# Patient Record
Sex: Female | Born: 1975 | Race: White | Hispanic: No | Marital: Married | Smoking: Never smoker
Health system: Southern US, Community
[De-identification: ages and names within clinical notes are randomized; demographics above are authoritative.]

## PROBLEM LIST (undated history)

## (undated) DIAGNOSIS — R569 Unspecified convulsions: Secondary | ICD-10-CM

## (undated) DIAGNOSIS — R51 Headache: Secondary | ICD-10-CM

## (undated) DIAGNOSIS — R519 Headache, unspecified: Secondary | ICD-10-CM

## (undated) DIAGNOSIS — D649 Anemia, unspecified: Secondary | ICD-10-CM

## (undated) DIAGNOSIS — M1990 Unspecified osteoarthritis, unspecified site: Secondary | ICD-10-CM

## (undated) DIAGNOSIS — G40909 Epilepsy, unspecified, not intractable, without status epilepticus: Secondary | ICD-10-CM

---

## 2014-03-03 ENCOUNTER — Ambulatory Visit: Payer: Self-pay | Admitting: Family Medicine

## 2014-07-18 ENCOUNTER — Emergency Department
Admission: EM | Admit: 2014-07-18 | Discharge: 2014-07-18 | Disposition: A | Discharge status: Home / Self Care | Payer: PRIVATE HEALTH INSURANCE | Attending: Emergency Medicine | Admitting: Emergency Medicine

## 2014-07-18 DIAGNOSIS — R569 Unspecified convulsions: Secondary | ICD-10-CM | POA: Diagnosis present

## 2014-07-18 DIAGNOSIS — G40909 Epilepsy, unspecified, not intractable, without status epilepticus: Secondary | ICD-10-CM | POA: Insufficient documentation

## 2014-07-18 DIAGNOSIS — Z79899 Other long term (current) drug therapy: Secondary | ICD-10-CM | POA: Diagnosis not present

## 2014-07-18 HISTORY — DX: Unspecified convulsions: R56.9

## 2014-07-18 HISTORY — DX: Epilepsy, unspecified, not intractable, without status epilepticus: G40.909

## 2014-07-18 LAB — PHENYTOIN LEVEL, TOTAL: Phenytoin Lvl: 13.7 ug/mL (ref 10.0–20.0)

## 2014-07-18 NOTE — ED Notes (Signed)
Pt alert and oriented X4, active, cooperative, pt in NAD. RR even and unlabored, color WNL.  Pt informed to return if any life threatening symptoms occur.  Left with family.  

## 2014-07-18 NOTE — ED Notes (Signed)
[facility].  Has not had seizure in 10 years per husband.  Pt post dictal and slightly confused on arrival.

## 2014-07-18 NOTE — ED Notes (Signed)
E signature will not accept at this time.

## 2014-07-18 NOTE — ED Notes (Addendum)
Pt had seizure while at breakfast this AM. Pt taken dilantin, took dose this AM. Pt denies aura before seizure, denies feeling post ictal. Pt alert and oriented X4, active, cooperative, pt in NAD. RR even and unlabored, color WNL.  Pt states that she was in floor when she had seizure, denies fall. EDP at bedside prior to assessment.

## 2014-07-18 NOTE — ED Provider Notes (Signed)
[facility] Emergency Department Provider Note  ____________________________________________  Time seen: 11 AM  I have reviewed the triage vital signs and the nursing notes.   HISTORY  Chief Complaint Seizures      HPI Tara Carlson is a 39 y.o. female who presents after a seizure. Patient has a history of epilepsy but has not had a seizure in 10 years mainly because of excellent compliance with Dilantin. She admits that she missed her nighttime dose of Dilantin last night which is what likely caused her seizure this morning. Reportedly she had a generalized tonic-clonic seizure at [employer] this morning. No injury to head or tongue. No loss of continence. Currently she feels well and has no complaints     Past Medical History  Diagnosis Date  . Seizure disorder   . Seizures     There are no active problems to display for this patient.   No past surgical history on file.  Current Outpatient Rx  Name  Route  Sig  Dispense  Refill  . phenytoin (DILANTIN) 100 MG ER capsule   Oral   Take 100 mg by mouth 2 (two) times daily. 2 tabs every morning andf 3 tabs every night           Allergies Review of patient's allergies indicates no known allergies.  No family history on file.  Social History History  Substance Use Topics  . Smoking status: Never Smoker   . Smokeless tobacco: Not on file  . Alcohol Use: No    Review of Systems  Constitutional: Negative for fever. Eyes: Negative for visual changes. ENT: Negative for sore throat. Cardiovascular: Negative for chest pain. Respiratory: Negative for shortness of breath. Gastrointestinal: Negative for abdominal pain, vomiting and diarrhea. Genitourinary: Negative for dysuria. Musculoskeletal: Negative for back pain. Skin: Negative for rash. Neurological: Negative for headaches, focal weakness or numbness. Psychiatric: No anxiety  10-point ROS otherwise  negative.  ____________________________________________   PHYSICAL EXAM:  VITAL SIGNS: ED Triage Vitals  Enc Vitals Group     BP --      Pulse --      Resp --      Temp --      Temp src --      SpO2 07/18/14 1020 96 %     Weight --      Height --      Head Cir --      Peak Flow --      Pain Score 07/18/14 1042 0     Pain Loc --      Pain Edu? --      Excl. in GC? --      Constitutional: Alert and oriented. Well appearing and in no distress. Eyes: Conjunctivae are normal. PERRL. Normal extraocular movements. ENT   Head: Normocephalic and atraumatic.   Nose: No congestion/rhinnorhea.   Mouth/Throat: Mucous membranes are moist.   Neck: No stridor. Hematological/Lymphatic/Immunilogical: No cervical lymphadenopathy. Cardiovascular: Normal rate, regular rhythm. Normal and symmetric distal pulses are present in all extremities. No murmurs, rubs, or gallops. Respiratory: Normal respiratory effort without tachypnea nor retractions. Breath sounds are clear and equal bilaterally. No wheezes/rales/rhonchi. Gastrointestinal: Soft and nontender. No distention. There is no CVA tenderness. Genitourinary: deferred Musculoskeletal: Nontender with normal range of motion in all extremities. No joint effusions.  No lower extremity tenderness nor edema. Neurologic:  Normal speech and language. No gross focal neurologic deficits are appreciated. Speech is normal.  Skin:  Skin is warm, dry  and intact. No rash noted. Psychiatric: Mood and affect are normal. Speech and behavior are normal. Patient exhibits appropriate insight and judgment.  ____________________________________________    LABS (pertinent positives/negatives)  Labs Reviewed  PHENYTOIN LEVEL, TOTAL     ____________________________________________   EKG  None  ____________________________________________    RADIOLOGY  None  ____________________________________________   PROCEDURES  Procedure(s)  performed:None  Critical Care performed: None  ____________________________________________   INITIAL IMPRESSION / ASSESSMENT AND PLAN / ED COURSE  Pertinent labs & imaging results that were available during my care of the patient were reviewed by me and considered in my medical decision making (see chart for details).  Seizure likely related to missed dose of Dilantin last night. We will check Dilantin level and observe patient in the ED  ____________________________________________ ----------------------------------------- 1:11 PM on 07/18/2014 -----------------------------------------  No seizure-like activity in ED. Dilantin level normal. Patient will resume normal Dilantin schedule.  FINAL CLINICAL IMPRESSION(S) / ED DIAGNOSES  Final diagnoses:  Seizure     Jene Everyobert Niclas Markell, MD 07/18/14 1311

## 2014-07-18 NOTE — Discharge Instructions (Signed)

## 2014-08-24 ENCOUNTER — Other Ambulatory Visit: Payer: Self-pay | Admitting: Family Medicine

## 2014-08-24 DIAGNOSIS — G40909 Epilepsy, unspecified, not intractable, without status epilepticus: Secondary | ICD-10-CM | POA: Insufficient documentation

## 2015-03-18 ENCOUNTER — Other Ambulatory Visit: Payer: Self-pay | Admitting: Family Medicine

## 2015-03-18 DIAGNOSIS — G40909 Epilepsy, unspecified, not intractable, without status epilepticus: Secondary | ICD-10-CM

## 2015-04-08 ENCOUNTER — Encounter: Payer: Self-pay | Admitting: Family Medicine

## 2015-04-08 ENCOUNTER — Ambulatory Visit (INDEPENDENT_AMBULATORY_CARE_PROVIDER_SITE_OTHER): Payer: PRIVATE HEALTH INSURANCE | Admitting: Family Medicine

## 2015-04-08 VITALS — BP 140/92 | HR 93 | Temp 98.2°F | Resp 16 | Ht 68.0 in | Wt 255.0 lb

## 2015-04-08 DIAGNOSIS — R4020 Unspecified coma: Secondary | ICD-10-CM | POA: Insufficient documentation

## 2015-04-08 DIAGNOSIS — Z87820 Personal history of traumatic brain injury: Secondary | ICD-10-CM | POA: Diagnosis not present

## 2015-04-08 DIAGNOSIS — Z9289 Personal history of other medical treatment: Secondary | ICD-10-CM | POA: Insufficient documentation

## 2015-04-08 DIAGNOSIS — G40909 Epilepsy, unspecified, not intractable, without status epilepticus: Secondary | ICD-10-CM

## 2015-04-08 DIAGNOSIS — M940 Chondrocostal junction syndrome [Tietze]: Secondary | ICD-10-CM | POA: Insufficient documentation

## 2015-04-08 DIAGNOSIS — E559 Vitamin D deficiency, unspecified: Secondary | ICD-10-CM | POA: Insufficient documentation

## 2015-04-08 NOTE — Patient Instructions (Signed)
We will call you with the lab results. 

## 2015-04-08 NOTE — Progress Notes (Signed)
Subjective:     Patient ID: Tara Carlson, female   DOB: 02-25-1976, 40 y.o.   MRN: [id number]  HPI  Chief Complaint  Patient presents with  . Facial Droop    Patient comes in office today accompanied by her husband with concerns of a possible seizure this morning. Patients spouse reports that employeers had noticed thart patient was staring blankly but was alert while at work, it was reported that patient had drooping off right side of face and arm. Patient denies any speech disturbance, visual changes or syncope episode. Patient mentioned that for the past several days she has had pain in her neck and back,patient reports that she does have a buldging disc.  Reports compliance with dilantin as prescribed. Last seizure documented in ER note last May. States she did not loss consciousness nor was incontinent during her episode today. Episode lasted about 5 minutes per her report. Reports eating breakfast prior to work.States that Sunday will be the anniversary of her GrFa's death. She is tearful talking about this. Accompanied by her husband today.   Review of Systems     Objective:   Physical Exam  Constitutional: She appears well-developed and well-nourished. No distress.  Eyes: EOM are normal. Pupils are equal, round, and reactive to light.  Musculoskeletal:  Grip strength 5/5 symmetrically.  Neurological: She is alert. Coordination (Romberg negative; Finger to Nose WNL) normal.       Assessment:    1. Seizure disorder [facility]): episode today does not well fit seizure criteria if she was aware of her surroundings. ? Psycho-emotional trigger. - Comprehensive metabolic panel - CBC with Differential/Platelet - Dilantin (Phenytoin) level, total - TSH    Plan:    Further f/u pending labs. Consider cranial CT if recurrent or SSRI if labs ok.

## 2015-04-09 ENCOUNTER — Telehealth: Payer: Self-pay

## 2015-04-09 LAB — COMPREHENSIVE METABOLIC PANEL
ALT: 16 IU/L (ref 0–32)
AST: 16 IU/L (ref 0–40)
Albumin/Globulin Ratio: 1.7 (ref 1.1–2.5)
Albumin: 4.3 g/dL (ref 3.5–5.5)
Alkaline Phosphatase: 114 IU/L (ref 39–117)
BUN/Creatinine Ratio: 13 (ref 9–23)
BUN: 8 mg/dL (ref 6–24)
Bilirubin Total: 0.2 mg/dL (ref 0.0–1.2)
CALCIUM: 8.9 mg/dL (ref 8.7–10.2)
CO2: 23 mmol/L (ref 18–29)
Chloride: 99 mmol/L (ref 96–106)
Creatinine, Ser: 0.61 mg/dL (ref 0.57–1.00)
GFR calc Af Amer: 130 mL/min/{1.73_m2} (ref >59)
GFR, EST NON AFRICAN AMERICAN: 113 mL/min/{1.73_m2} (ref >59)
Globulin, Total: 2.6 g/dL (ref 1.5–4.5)
Glucose: 92 mg/dL (ref 65–99)
POTASSIUM: 4.5 mmol/L (ref 3.5–5.2)
Sodium: 138 mmol/L (ref 134–144)
Total Protein: 6.9 g/dL (ref 6.0–8.5)

## 2015-04-09 LAB — CBC WITH DIFFERENTIAL/PLATELET
BASOS ABS: 0 10*3/uL (ref 0.0–0.2)
Basos: 1 %
EOS (ABSOLUTE): 0 10*3/uL (ref 0.0–0.4)
Eos: 1 %
Hematocrit: 37 % (ref 34.0–46.6)
Hemoglobin: 12.6 g/dL (ref 11.1–15.9)
IMMATURE GRANS (ABS): 0 10*3/uL (ref 0.0–0.1)
IMMATURE GRANULOCYTES: 0 %
LYMPHS: 13 %
Lymphocytes Absolute: 0.7 10*3/uL (ref 0.7–3.1)
MCH: 31.2 pg (ref 26.6–33.0)
MCHC: 34.1 g/dL (ref 31.5–35.7)
MCV: 92 fL (ref 79–97)
Monocytes Absolute: 0.4 10*3/uL (ref 0.1–0.9)
Monocytes: 7 %
NEUTROS PCT: 78 %
Neutrophils Absolute: 4.2 10*3/uL (ref 1.4–7.0)
Platelets: 256 10*3/uL (ref 150–379)
RBC: 4.04 x10E6/uL (ref 3.77–5.28)
RDW: 12.7 % (ref 12.3–15.4)
WBC: 5.4 10*3/uL (ref 3.4–10.8)

## 2015-04-09 LAB — TSH: TSH: 1.67 u[IU]/mL (ref 0.450–4.500)

## 2015-04-09 LAB — PHENYTOIN LEVEL, TOTAL: PHENYTOIN (DILANTIN), SERUM: 22.3 ug/mL — AB (ref 10.0–20.0)

## 2015-04-09 NOTE — Telephone Encounter (Signed)
[id number] 

## 2015-04-09 NOTE — Telephone Encounter (Signed)
Pt returned call. Thanks TNP °

## 2015-04-09 NOTE — Telephone Encounter (Signed)
-----   Message from Anola Gurney, [place] sent at 04/09/2015  7:57 AM EST ----- Labs look good. Dilantin level is not low. Would you like to see a neurologist?

## 2015-04-12 ENCOUNTER — Other Ambulatory Visit: Payer: Self-pay | Admitting: Family Medicine

## 2015-04-12 DIAGNOSIS — G40909 Epilepsy, unspecified, not intractable, without status epilepticus: Secondary | ICD-10-CM

## 2015-04-12 NOTE — Telephone Encounter (Signed)
Patient advised as below. Patient is willing to see neurologist for further evaluation.

## 2015-04-12 NOTE — Telephone Encounter (Signed)
Pt called back to get results. Pt request a call back on work # today if possible. Thanks TNP

## 2015-04-12 NOTE — Telephone Encounter (Signed)
Referral in progress. 

## 2015-05-04 ENCOUNTER — Other Ambulatory Visit: Payer: Self-pay | Admitting: Neurology

## 2015-05-04 DIAGNOSIS — G40919 Epilepsy, unspecified, intractable, without status epilepticus: Secondary | ICD-10-CM

## 2015-05-17 ENCOUNTER — Ambulatory Visit: Payer: PRIVATE HEALTH INSURANCE

## 2015-07-15 ENCOUNTER — Ambulatory Visit
Admission: RE | Admit: 2015-07-15 | Discharge: 2015-07-15 | Disposition: A | Discharge status: Home / Self Care | Payer: PRIVATE HEALTH INSURANCE | Source: Ambulatory Visit | Attending: Neurology | Admitting: Neurology

## 2015-07-15 DIAGNOSIS — G40319 Generalized idiopathic epilepsy and epileptic syndromes, intractable, without status epilepticus: Secondary | ICD-10-CM | POA: Diagnosis present

## 2015-07-15 DIAGNOSIS — G40919 Epilepsy, unspecified, intractable, without status epilepticus: Secondary | ICD-10-CM

## 2015-07-15 DIAGNOSIS — H7091 Unspecified mastoiditis, right ear: Secondary | ICD-10-CM | POA: Diagnosis not present

## 2015-07-15 MED ORDER — GADOBENATE DIMEGLUMINE 529 MG/ML IV SOLN
20.0000 mL | Freq: Once | INTRAVENOUS | Status: AC | PRN
  Administered 2015-07-15: 20 mL via INTRAVENOUS

## 2015-08-22 ENCOUNTER — Emergency Department
Admission: EM | Admit: 2015-08-22 | Discharge: 2015-08-22 | Disposition: A | Discharge status: Home / Self Care | Payer: PRIVATE HEALTH INSURANCE | Attending: Emergency Medicine | Admitting: Emergency Medicine

## 2015-08-22 ENCOUNTER — Encounter: Payer: Self-pay | Admitting: Emergency Medicine

## 2015-08-22 DIAGNOSIS — Z79899 Other long term (current) drug therapy: Secondary | ICD-10-CM | POA: Insufficient documentation

## 2015-08-22 DIAGNOSIS — G40909 Epilepsy, unspecified, not intractable, without status epilepticus: Secondary | ICD-10-CM | POA: Insufficient documentation

## 2015-08-22 DIAGNOSIS — R569 Unspecified convulsions: Secondary | ICD-10-CM

## 2015-08-22 LAB — BASIC METABOLIC PANEL
Anion gap: 17 — ABNORMAL HIGH (ref 5–15)
BUN: 11 mg/dL (ref 6–20)
CALCIUM: 9 mg/dL (ref 8.9–10.3)
CO2: 19 mmol/L — AB (ref 22–32)
CREATININE: 0.79 mg/dL (ref 0.44–1.00)
Chloride: 101 mmol/L (ref 101–111)
GFR calc non Af Amer: >60 mL/min (ref >60)
Glucose, Bld: 93 mg/dL (ref 65–99)
Potassium: 3.7 mmol/L (ref 3.5–5.1)
Sodium: 137 mmol/L (ref 135–145)

## 2015-08-22 LAB — CBC WITH DIFFERENTIAL/PLATELET
Basophils Absolute: 0.1 10*3/uL (ref 0–0.1)
Basophils Relative: 1 %
EOS PCT: 3 %
Eosinophils Absolute: 0.3 10*3/uL (ref 0–0.7)
HEMATOCRIT: 39 % (ref 35.0–47.0)
Hemoglobin: 13.4 g/dL (ref 12.0–16.0)
Lymphocytes Relative: 29 %
Lymphs Abs: 2.9 10*3/uL (ref 1.0–3.6)
MCH: 32.2 pg (ref 26.0–34.0)
MCHC: 34.4 g/dL (ref 32.0–36.0)
MCV: 93.6 fL (ref 80.0–100.0)
MONO ABS: 1.3 10*3/uL — AB (ref 0.2–0.9)
MONOS PCT: 13 %
Neutro Abs: 5.3 10*3/uL (ref 1.4–6.5)
Neutrophils Relative %: 54 %
PLATELETS: 280 10*3/uL (ref 150–440)
RBC: 4.17 MIL/uL (ref 3.80–5.20)
RDW: 13.5 % (ref 11.5–14.5)
WBC: 9.8 10*3/uL (ref 3.6–11.0)

## 2015-08-22 LAB — PHENYTOIN LEVEL, TOTAL: Phenytoin Lvl: 18.5 ug/mL (ref 10.0–20.0)

## 2015-08-22 MED ORDER — SODIUM CHLORIDE 0.9 % IV BOLUS (SEPSIS)
1000.0000 mL | Freq: Once | INTRAVENOUS | Status: AC
  Administered 2015-08-22: 1000 mL via INTRAVENOUS

## 2015-08-22 NOTE — ED Provider Notes (Signed)
[facility] Emergency Department Provider Note   ____________________________________________  Time seen: Approximately 530 PM  I have reviewed the triage vital signs and the nursing notes.   HISTORY  Chief Complaint Seizures    HPI Tara Carlson is a 40 y.o. female with a history of a seizure disorder on Dilantin was presented to the emergency department today after seizure. She says that stress brings on her seizures and she has been very stressed regarding situations with her oldest daughter. The patient's husband says that she can feel the seizure coming on prior to arrival. He says that she started slurring her words and squeezing his hand which has been a prodrome in the past for her. He said once in the car on the way to the hospital the patient had a generalized seizure lasting 3-4 minutes. He said that the patient is now confused and that the last seizure, about one year ago, did not result in confusion afterward. The patient is denying any pain. Denies any weakness or numbness.   Past Medical History  Diagnosis Date  . Seizure disorder (HCC)   . Seizures [facility](HCC)     Patient Active Problem List   Diagnosis Date Noted  . Avitaminosis D 04/08/2015  . H/O traumatic brain injury 04/08/2015  . Seizure disorder (HCC) 08/24/2014    History reviewed. No pertinent past surgical history.  Current Outpatient Rx  Name  Route  Sig  Dispense  Refill  . phenytoin (DILANTIN) 100 MG ER capsule      take 2 capsules by mouth every morning AND 3 CAPSULES EVERY EVENING   150 capsule   6     Allergies Review of patient's allergies indicates no known allergies.  History reviewed. No pertinent family history.  Social History Social History  Substance Use Topics  . Smoking status: Never Smoker   . Smokeless tobacco: None  . Alcohol Use: No    Review of Systems Constitutional: No fever/chills Eyes: No visual changes. ENT: No sore  throat. Cardiovascular: Denies chest pain. Respiratory: Denies shortness of breath. Gastrointestinal: No abdominal pain.  No nausea, no vomiting.  No diarrhea.  No constipation. Genitourinary: Negative for dysuria. Musculoskeletal: Negative for back pain. Skin: Negative for rash. Neurological: Negative for headaches, focal weakness or numbness.  10-point ROS otherwise negative.  ____________________________________________   PHYSICAL EXAM:  VITAL SIGNS: ED Triage Vitals  Enc Vitals Group     BP 08/22/15 1724 145/77 mmHg     Pulse Rate 08/22/15 1724 110     Resp 08/22/15 1724 11     Temp --      Temp src --      SpO2 08/22/15 1724 99 %     Weight 08/22/15 1724 244 lb (110.678 kg)     Height 08/22/15 1724 5\' 9"  (1.753 m)     Head Cir --      Peak Flow --      Pain Score --      Pain Loc --      Pain Edu? --      Excl. in GC? --     Constitutional: Alert and orientedBut slightly confused and slow to respond. Well appearing and in no acute distress. Eyes: Conjunctivae are normal. PERRL. EOMI. Head: Atraumatic. Nose: No congestion/rhinnorhea. Mouth/Throat: Mucous membranes are moist.  Left-sided abrasion to the tongue likely tongue bite. Neck: No stridor.   Cardiovascular: Normal rate, regular rhythm. Grossly normal heart sounds.   Respiratory: Normal respiratory effort.  No retractions. Lungs CTAB. Gastrointestinal: Soft and nontender. No distention.  Musculoskeletal: No lower extremity tenderness nor edema.  No joint effusions. Neurologic:  Normal speech and language. No gross focal neurologic deficits are appreciated. Appears to be neglecting her left side but when I make her aware of her left side she moves it with a 5 out of 5 strength to the upper and lower extremities. No facial droop. Skin:  Skin is warm, dry and intact. No rash noted. Psychiatric: Mood and affect are normal. Speech and behavior are normal.  ____________________________________________    LABS (all labs ordered are listed, but only abnormal results are displayed)  Labs Reviewed  CBC WITH DIFFERENTIAL/PLATELET - Abnormal; Notable for the following:    Monocytes Absolute 1.3 (*)    All other components within normal limits  BASIC METABOLIC PANEL - Abnormal; Notable for the following:    CO2 19 (*)    Anion gap 17 (*)    All other components within normal limits  PHENYTOIN LEVEL, TOTAL   ____________________________________________  EKG   ____________________________________________  RADIOLOGY   ____________________________________________   PROCEDURES   ____________________________________________   INITIAL IMPRESSION / ASSESSMENT AND PLAN / ED COURSE  Pertinent labs & imaging results that were available during my care of the patient were reviewed by me and considered in my medical decision making (see chart for details).  ----------------------------------------- 7:06 PM on 08/22/2015 -----------------------------------------   Patient back to her baseline. 5 out of 5 strength throughout. Discussed case Dr. Amada JupiterKirkpatrick of neurology. He does not recommend any medication changes at this time especially with the Dilantin level being where it is. Patient's heart rate while in the room is 97 bpm. She is aware that she must not drive until she is cleared to resume by her neurologist. She'll be following up with Dr[facility]ll [facility]. She says that every time that she has a seizure, which is about once per year, that she is at the beginning of her period which she is at right now as well. Possible seizure induced by stress as well as being on her menses. Patient understands plan for discharge and willing to comply. ____________________________________________   FINAL CLINICAL IMPRESSION(S) / ED DIAGNOSES  Seizure.    NEW MEDICATIONS STARTED DURING THIS VISIT:  New Prescriptions   No medications on file     Note:  This document was prepared  using Dragon voice recognition software and may include unintentional dictation errors.    Myrna Blazeravid Matthew Durwin Davisson, MD 08/22/15 Windell [id number]

## 2015-08-22 NOTE — Discharge Instructions (Signed)
You must not drive until you are cleared to resume by a neurologist. Continue to take your Dilantin as prescribed.  Seizure, Adult A seizure is abnormal electrical activity in the brain. Seizures usually last from 30 seconds to 2 minutes. There are various types of seizures. Before a seizure, you may have a warning sensation (aura) that a seizure is about to occur. An aura may include the following symptoms:   Fear or anxiety.  Nausea.  Feeling like the room is spinning (vertigo).  Vision changes, such as seeing flashing lights or spots. Common symptoms during a seizure include:  A change in attention or behavior (altered mental status).  Convulsions with rhythmic jerking movements.  Drooling.  Rapid eye movements.  Grunting.  Loss of bladder and bowel control.  Bitter taste in the mouth.  Tongue biting. After a seizure, you may feel confused and sleepy. You may also have an injury resulting from convulsions during the seizure. HOME CARE INSTRUCTIONS   If you are given medicines, take them exactly as prescribed by your health care provider.  Keep all follow-up appointments as directed by your health care provider.  Do not swim or drive or engage in risky activity during which a seizure could cause further injury to you or others until your health care provider says it is OK.  Get adequate rest.  Teach friends and family what to do if you have a seizure. They should:  Lay you on the ground to prevent a fall.  Put a cushion under your head.  Loosen any tight clothing around your neck.  Turn you on your side. If vomiting occurs, this helps keep your airway clear.  Stay with you until you recover.  Know whether or not you need emergency care. SEEK IMMEDIATE MEDICAL CARE IF:  The seizure lasts longer than 5 minutes.  The seizure is severe or you do not wake up immediately after the seizure.  You have an altered mental status after the seizure.  You are having  more frequent or worsening seizures. Someone should drive you to the emergency department or call local emergency services (911 in [place] MAKE SURE YOU:  Understand these instructions.  Will watch your condition.  Will get help right away if you are not doing well or get worse.   This information is not intended to replace advice given to you by your health care provider. Make sure you discuss any questions you have with your health care provider.   Document Released: 02/11/2000 Document Revised: 03/06/2014 Document Reviewed: 09/25/2012 Elsevier Interactive Patient Education [employer]2016 [employer] Inc.

## 2015-08-22 NOTE — ED Notes (Signed)
Pt has hx of seizure and had one around 5pm - brought into ed by husband. Bit her tongue today, post tictal originally but now answering questions.

## 2015-08-22 NOTE — ED Notes (Signed)
Pt. Going home with family. 

## 2015-11-18 LAB — HM PAP SMEAR

## 2015-11-19 ENCOUNTER — Ambulatory Visit
Admission: RE | Admit: 2015-11-19 | Discharge: 2015-11-19 | Disposition: A | Discharge status: Home / Self Care | Payer: PRIVATE HEALTH INSURANCE | Source: Ambulatory Visit | Attending: Obstetrics and Gynecology | Admitting: Obstetrics and Gynecology

## 2015-11-19 DIAGNOSIS — D649 Anemia, unspecified: Secondary | ICD-10-CM | POA: Diagnosis not present

## 2015-11-19 DIAGNOSIS — Z01812 Encounter for preprocedural laboratory examination: Secondary | ICD-10-CM | POA: Insufficient documentation

## 2015-11-19 DIAGNOSIS — N939 Abnormal uterine and vaginal bleeding, unspecified: Secondary | ICD-10-CM | POA: Insufficient documentation

## 2015-11-19 HISTORY — DX: Headache: R51

## 2015-11-19 HISTORY — DX: Headache, unspecified: R51.9

## 2015-11-19 HISTORY — DX: Anemia, unspecified: D64.9

## 2015-11-19 LAB — CBC
HEMATOCRIT: 36.9 % (ref 35.0–47.0)
HEMOGLOBIN: 12.9 g/dL (ref 12.0–16.0)
MCH: 32.1 pg (ref 26.0–34.0)
MCHC: 34.9 g/dL (ref 32.0–36.0)
MCV: 92 fL (ref 80.0–100.0)
Platelets: 192 10*3/uL (ref 150–440)
RBC: 4.01 MIL/uL (ref 3.80–5.20)
RDW: 13 % (ref 11.5–14.5)
WBC: 4.8 10*3/uL (ref 3.6–11.0)

## 2015-11-19 LAB — TYPE AND SCREEN
ABO/RH(D): O NEG
ANTIBODY SCREEN: NEGATIVE

## 2015-11-19 LAB — BASIC METABOLIC PANEL
ANION GAP: 5 (ref 5–15)
BUN: 8 mg/dL (ref 6–20)
CALCIUM: 8.6 mg/dL — AB (ref 8.9–10.3)
CO2: 26 mmol/L (ref 22–32)
Chloride: 107 mmol/L (ref 101–111)
Creatinine, Ser: 0.62 mg/dL (ref 0.44–1.00)
Glucose, Bld: 87 mg/dL (ref 65–99)
POTASSIUM: 5 mmol/L (ref 3.5–5.1)
SODIUM: 138 mmol/L (ref 135–145)

## 2015-11-19 NOTE — Patient Instructions (Signed)
  Your procedure is scheduled on:November 26, 2015 (Friday) Report to [facility] Day Surgery 2nd floor [facility] To find out your arrival time please call [phone] between 1PM - 3PM on  November 25, 2015 (Thursday)  Remember: Instructions that are not followed completely may result in serious medical risk, up to and including death, or upon the discretion of your surgeon and anesthesiologist your surgery may need to be rescheduled.    _x___ 1. Do not eat food or drink liquids after midnight. No gum chewing or hard candies.     _x__ 2. No Alcohol for 24 hours before or after surgery.   x___3. No Smoking for 24 prior to surgery.   ____  4. Bring all medications with you on the day of surgery if instructed.    __x__ 5. Notify your doctor if there is any change in your medical condition     (cold, fever, infections).     Do not wear jewelry, make-up, hairpins, clips or nail polish.  Do not wear lotions, powders, or perfumes. You may wear deodorant.  Do not shave 48 hours prior to surgery. Men may shave face and neck.  Do not bring valuables to the hospital.    [facility] is not [facility] for any belongings or valuables.               Contacts, dentures or bridgework may not be worn into surgery.  Leave your suitcase in the car. After surgery it may be brought to your room.  For patients admitted to the hospital, discharge time is determined by your treatment team.   Patients discharged the day of surgery will not be allowed to drive home.    Please read over the following fact sheets that you were given:   [facility] Preparing for Surgery and or MRSA Information   _x___ Take these medicines the morning of surgery with A SIP OF WATER:    1. Dilantin  2. Levetiracetam (Keppra)  3.  4.  5.  6.  ____Fleets enema or Magnesium Citrate as directed.   _x___ Use CHG Soap or sage wipes as directed on instruction sheet   ____ Use inhalers on the day of surgery and bring to  hospital day of surgery  ____ Stop metformin 2 days prior to surgery    ____ Take 1/2 of usual insulin dose the night before surgery and none on the morning of  surgery.        _x__ Stop aspirin or coumadin, or plavix (NO ASPIRIN)  x__ Stop Anti-inflammatories such as Advil, Aleve, Ibuprofen, Motrin, Naproxen,          Naprosyn, Goodies powders or aspirin products. Ok to take Tylenol.   ____ Stop supplements until after surgery.    ____ Bring C-Pap to the hospital.

## 2015-11-26 ENCOUNTER — Encounter
Admission: RE | Disposition: A | Discharge status: Home / Self Care | Payer: Self-pay | Source: Ambulatory Visit | Attending: Obstetrics and Gynecology

## 2015-11-26 ENCOUNTER — Ambulatory Visit: Payer: PRIVATE HEALTH INSURANCE | Admitting: Anesthesiology

## 2015-11-26 ENCOUNTER — Encounter: Payer: Self-pay | Admitting: *Deleted

## 2015-11-26 ENCOUNTER — Ambulatory Visit
Admission: RE | Admit: 2015-11-26 | Discharge: 2015-11-26 | Disposition: A | Discharge status: Home / Self Care | Payer: PRIVATE HEALTH INSURANCE | Source: Ambulatory Visit | Attending: Obstetrics and Gynecology | Admitting: Obstetrics and Gynecology

## 2015-11-26 DIAGNOSIS — N882 Stricture and stenosis of cervix uteri: Secondary | ICD-10-CM | POA: Insufficient documentation

## 2015-11-26 DIAGNOSIS — Z8249 Family history of ischemic heart disease and other diseases of the circulatory system: Secondary | ICD-10-CM | POA: Insufficient documentation

## 2015-11-26 DIAGNOSIS — Z809 Family history of malignant neoplasm, unspecified: Secondary | ICD-10-CM | POA: Insufficient documentation

## 2015-11-26 DIAGNOSIS — Z79899 Other long term (current) drug therapy: Secondary | ICD-10-CM | POA: Diagnosis not present

## 2015-11-26 DIAGNOSIS — M1990 Unspecified osteoarthritis, unspecified site: Secondary | ICD-10-CM | POA: Insufficient documentation

## 2015-11-26 DIAGNOSIS — N921 Excessive and frequent menstruation with irregular cycle: Secondary | ICD-10-CM | POA: Diagnosis present

## 2015-11-26 DIAGNOSIS — D649 Anemia, unspecified: Secondary | ICD-10-CM | POA: Insufficient documentation

## 2015-11-26 HISTORY — PX: DILITATION & CURRETTAGE/HYSTROSCOPY WITH NOVASURE ABLATION: SHX5568

## 2015-11-26 LAB — POCT PREGNANCY, URINE
Preg Test, Ur: NEGATIVE
Preg Test, Ur: NEGATIVE

## 2015-11-26 LAB — ABO/RH: ABO/RH(D): O NEG

## 2015-11-26 LAB — HM PAP SMEAR

## 2015-11-26 SURGERY — DILATATION & CURETTAGE/HYSTEROSCOPY WITH NOVASURE ABLATION
Anesthesia: General | Site: Vagina | Wound class: Clean Contaminated

## 2015-11-26 MED ORDER — LIDOCAINE HCL (CARDIAC) 20 MG/ML IV SOLN
INTRAVENOUS | Status: DC | PRN
  Administered 2015-11-26: 60 mg via INTRAVENOUS

## 2015-11-26 MED ORDER — OXYCODONE-ACETAMINOPHEN 5-325 MG PO TABS
ORAL_TABLET | ORAL | Status: AC
  Administered 2015-11-26: 1
  Filled 2015-11-26: qty 1

## 2015-11-26 MED ORDER — MIDAZOLAM HCL 5 MG/5ML IJ SOLN
INTRAMUSCULAR | Status: DC | PRN
  Administered 2015-11-26: 2 mg via INTRAVENOUS

## 2015-11-26 MED ORDER — OXYCODONE-ACETAMINOPHEN 5-325 MG PO TABS: 1.0000 | ORAL_TABLET | Freq: Four times a day (QID) | ORAL | 0 refills | Status: DC | PRN

## 2015-11-26 MED ORDER — FENTANYL CITRATE (PF) 100 MCG/2ML IJ SOLN
INTRAMUSCULAR | Status: AC
  Administered 2015-11-26: 25 ug via INTRAVENOUS
  Filled 2015-11-26: qty 2

## 2015-11-26 MED ORDER — LACTATED RINGERS IV SOLN
INTRAVENOUS | Status: DC
  Administered 2015-11-26 (×2): via INTRAVENOUS

## 2015-11-26 MED ORDER — FENTANYL CITRATE (PF) 100 MCG/2ML IJ SOLN
INTRAMUSCULAR | Status: DC | PRN
  Administered 2015-11-26: 50 ug via INTRAVENOUS
  Administered 2015-11-26 (×2): 25 ug via INTRAVENOUS

## 2015-11-26 MED ORDER — DEXAMETHASONE SODIUM PHOSPHATE 10 MG/ML IJ SOLN
INTRAMUSCULAR | Status: DC | PRN
  Administered 2015-11-26: 5 mg via INTRAVENOUS

## 2015-11-26 MED ORDER — FAMOTIDINE 20 MG PO TABS
20.0000 mg | ORAL_TABLET | Freq: Once | ORAL | Status: AC
  Administered 2015-11-26: 20 mg via ORAL

## 2015-11-26 MED ORDER — ONDANSETRON HCL 4 MG/2ML IJ SOLN
INTRAMUSCULAR | Status: DC | PRN
  Administered 2015-11-26: 4 mg via INTRAVENOUS

## 2015-11-26 MED ORDER — FENTANYL CITRATE (PF) 100 MCG/2ML IJ SOLN
25.0000 ug | INTRAMUSCULAR | Status: DC | PRN
  Administered 2015-11-26 (×4): 25 ug via INTRAVENOUS

## 2015-11-26 MED ORDER — IBUPROFEN 800 MG PO TABS: 800.0000 mg | ORAL_TABLET | Freq: Three times a day (TID) | ORAL | 0 refills | Status: AC | PRN

## 2015-11-26 MED ORDER — PROPOFOL 10 MG/ML IV BOLUS
INTRAVENOUS | Status: DC | PRN
  Administered 2015-11-26: 200 mg via INTRAVENOUS

## 2015-11-26 MED ORDER — KETOROLAC TROMETHAMINE 30 MG/ML IJ SOLN
INTRAMUSCULAR | Status: DC | PRN
  Administered 2015-11-26: 30 mg via INTRAVENOUS

## 2015-11-26 MED ORDER — DOCUSATE SODIUM 100 MG PO CAPS: 100.0000 mg | ORAL_CAPSULE | Freq: Every day | ORAL | 3 refills | Status: DC | PRN

## 2015-11-26 MED ORDER — ONDANSETRON HCL 4 MG/2ML IJ SOLN
4.0000 mg | Freq: Once | INTRAMUSCULAR | Status: AC | PRN
  Administered 2015-11-26: 4 mg via INTRAVENOUS

## 2015-11-26 MED ORDER — ONDANSETRON HCL 4 MG/2ML IJ SOLN
INTRAMUSCULAR | Status: AC
  Administered 2015-11-26: 4 mg via INTRAVENOUS
  Filled 2015-11-26: qty 2

## 2015-11-26 MED ORDER — FAMOTIDINE 20 MG PO TABS
ORAL_TABLET | ORAL | Status: AC
  Administered 2015-11-26: 20 mg via ORAL
  Filled 2015-11-26: qty 1

## 2015-11-26 SURGICAL SUPPLY — 17 items
CANISTER SUCT 1200ML W/VALVE (MISCELLANEOUS) ×3 IMPLANT
CATH ROBINSON RED A/P 16FR (CATHETERS) ×3 IMPLANT
GLOVE BIO SURGEON STRL SZ 6 (GLOVE) ×3 IMPLANT
GLOVE INDICATOR 6.5 STRL GRN (GLOVE) ×3 IMPLANT
GOWN STRL REUS W/ TWL LRG LVL3 (GOWN DISPOSABLE) ×2 IMPLANT
GOWN STRL REUS W/TWL LRG LVL3 (GOWN DISPOSABLE) ×4
IV LACTATED RINGERS 1000ML (IV SOLUTION) ×3 IMPLANT
KIT RM TURNOVER CYSTO AR (KITS) ×3 IMPLANT
NOVASURE ENDOMETRIAL ABLATION (MISCELLANEOUS) ×3 IMPLANT
NS IRRIG 500ML POUR BTL (IV SOLUTION) ×3 IMPLANT
PACK DNC HYST (MISCELLANEOUS) ×3 IMPLANT
PAD OB MATERNITY 4.3X12.25 (PERSONAL CARE ITEMS) ×3 IMPLANT
PAD PREP 24X41 OB/GYN DISP (PERSONAL CARE ITEMS) ×3 IMPLANT
SPONGE LAP 18X18 5 PK (GAUZE/BANDAGES/DRESSINGS) ×3 IMPLANT
TOWEL OR 17X26 4PK STRL BLUE (TOWEL DISPOSABLE) ×3 IMPLANT
TUBING CONNECTING 10 (TUBING) ×2 IMPLANT
TUBING CONNECTING 10' (TUBING) ×1

## 2015-11-26 NOTE — Discharge Instructions (Signed)
Discharge instructions after a hysteroscopy with dilation and curettage  Signs and Symptoms to Report  Call our office at [phone] if you have any of the following:   . Fever over 100.4 degrees or higher . Severe stomach pain not relieved with pain medications . Bright red bleeding that's heavier than a period that does not slow with rest after the first 24 hours . To go the bathroom a lot (frequency), you can't hold your urine (urgency), or it hurts when you empty your bladder (urinate) . Chest pain . Shortness of breath . Pain in the calves of your legs . Severe nausea and vomiting not relieved with anti-nausea medications . Any concerns  What You Can Expect after Surgery . You may see some pink tinged, bloody fluid. This is normal. You may also have cramping for several days.   Activities after Your Discharge Follow these guidelines to help speed your recovery at home: . Don't drive if you are in pain or taking narcotic pain medicine. You may drive when you can safely slam on the brakes, turn the wheel forcefully, and rotate your torso comfortably. This is typically 4-7 days. Practice in a parking lot or side street prior to attempting to drive regularly.  . Ask others to help with household chores for 4 weeks. . Don't do strenuous activities, exercises, or sports like vacuuming, tennis, squash, etc. until your doctor says it is safe to do so. . Walk as you feel able. Rest often since it may take a week or two for your energy level to return to normal.  . You may climb stairs . Avoid constipation:   -Eat fruits, vegetables, and whole grains. Eat small meals as your appetite will take time to return to normal.   -Drink 6 to 8 glasses of water each day unless your doctor has told you to limit your fluids.   -Use a laxative or stool softener as needed if constipation becomes a problem. You may take Miralax, metamucil, Citrucil, Colace, Senekot, FiberCon, etc. If this does not  relieve the constipation, try two tablespoons of Milk Of Magnesia every 8 hours until your bowels move.  . You may shower.  . Do not get in a hot tub, swimming pool, etc. until your doctor agrees. . Do not douche, use tampons, or have sex until your doctor says it is okay, usually about 2 weeks. . Take your pain medicine when you need it. The medicine may not work as well if the pain is bad.  Take the medicines you were taking before surgery. Other medications you might need are pain medications (ibuprofen), medications for constipation (Colace) and nausea medications (Zofran).   AMBULATORY SURGERY  DISCHARGE INSTRUCTIONS   1) The drugs that you were given will stay in your system until tomorrow so for the next 24 hours you should not:  A) Drive an automobile B) Make any legal decisions C) Drink any alcoholic beverage   2) You may resume regular meals tomorrow.  Today it is better to start with liquids and gradually work up to solid foods.  You may eat anything you prefer, but it is better to start with liquids, then soup and crackers, and gradually work up to solid foods.   3) Please notify your doctor immediately if you have any unusual bleeding, trouble breathing, redness and pain at the surgery site, drainage, fever, or pain not relieved by medication.    4) Additional Instructions: TAKE A STOOL SOFTENER TWICE A DAY  WHILE TAKING NARCOTIC PAIN MEDICINE TO PREVENT CONSTIPATION   Please contact your physician with any problems or Same Day Surgery at [phone], Monday through Friday 6 am to 4 pm, or [place] at [facility] Main number at [phone].  AMBULATORY SURGERY  DISCHARGE INSTRUCTIONS   5) The drugs that you were given will stay in your system until tomorrow so for the next 24 hours you should not:  D) Drive an automobile E) Make any legal decisions F) Drink any alcoholic beverage   6) You may resume regular meals tomorrow.  Today it is better to start with  liquids and gradually work up to solid foods.  You may eat anything you prefer, but it is better to start with liquids, then soup and crackers, and gradually work up to solid foods.   7) Please notify your doctor immediately if you have any unusual bleeding, trouble breathing, redness and pain at the surgery site, drainage, fever, or pain not relieved by medication.    8) Additional Instructions: TAKE A STOOL SOFTENER TWICE A DAY WHILE TAKING NARCOTIC PAIN MEDICINE TO PREVENT CONSTIPATION   Please contact your physician with any problems or Same Day Surgery at [phone], Monday through Friday 6 am to 4 pm, or [place] at [facility] Main number at [phone].

## 2015-11-26 NOTE — Interval H&P Note (Signed)
History and Physical Interval Note:  11/26/2015 1:59 PM  Tara Carlson  has presented today for surgery, with the diagnosis of AUB  Cervical stenosis  The various methods of treatment have been discussed with the patient and family. After consideration of risks, benefits and other options for treatment, the patient has consented to  Procedure(s): DILATATION & CURETTAGE/HYSTEROSCOPY WITH NOVASURE ABLATION (N/A) as a surgical intervention .  The patient's history has been reviewed, patient examined, no change in status, stable for surgery.  I have reviewed the patient's chart and labs.  Questions were answered to the patient's satisfaction.     Christeen DouglasBEASLEY, Channing Savich

## 2015-11-26 NOTE — Anesthesia Postprocedure Evaluation (Signed)
Anesthesia Post Note  Patient: Tara Carlson  Procedure(s) Performed: Procedure(s) (LRB): DILATATION & CURETTAGE/HYSTEROSCOPY WITH NOVASURE ABLATION (N/A)  Patient location during evaluation: PACU Anesthesia Type: General Level of consciousness: awake and alert and oriented Pain management: pain level controlled Vital Signs Assessment: post-procedure vital signs reviewed and stable Respiratory status: spontaneous breathing Cardiovascular status: blood pressure returned to baseline Anesthetic complications: no    Last Vitals:  Vitals:   11/26/15 1509 11/26/15 1552  BP: 130/72 121/69  Pulse: 70 71  Resp: 12 12  Temp:  36.6 C    Last Pain:  Vitals:   11/26/15 1552  TempSrc:   PainSc: 5                  Renna Kilmer

## 2015-11-26 NOTE — Anesthesia Procedure Notes (Signed)
Procedure Name: LMA Insertion Date/Time: 11/26/2015 1:34 PM Performed by: Irving BurtonBACHICH, Vaunda Gutterman Pre-anesthesia Checklist: Patient identified, Emergency Drugs available, Suction available and Patient being monitored Patient Re-evaluated:Patient Re-evaluated prior to inductionOxygen Delivery Method: Circle system utilized Preoxygenation: Pre-oxygenation with 100% oxygen Intubation Type: IV induction Ventilation: Mask ventilation without difficulty LMA: LMA inserted LMA Size: 4.0 Number of attempts: 1 Placement Confirmation: positive ETCO2 and breath sounds checked- equal and bilateral Tube secured with: Tape Dental Injury: Teeth and Oropharynx as per pre-operative assessment
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
## 2015-11-26 NOTE — H&P (Signed)
[name] is a 40 y.o. female here for Follow-up (u/s f/u aub) . F/u for AUB with anemia: - Cervical stenosis: No EMBX - TVUS today without fibroids, 8mm ES, normal adnexa - Last pap smear: unknown - Hx of headaches and seizures - on Keppra and dilantin - Maternal hx of endometrial cancer at age 89yo. No Fhx of colon cancer. Both MGparents lived into 90s.  - History of bilateral tubal ligation  After 30 min of discussion, pt decided on a D&C with Mirena placement and pap smear. She did call the office several days later to proceed with a Novasure ablation instead. She was reconsented for this procedure by phone, as we had discussed it in the office during her preop visit.  Past Medical History:  has a past medical history of Anemia; Arthritis; and Seizures (CMS-HCC).  Past Surgical History:  has a past surgical history that includes Cesarean section. Family History: family history includes Cancer in her mother; Heart attack in her mother. Social History:  reports that she has never smoked. She has never used smokeless tobacco. She reports that she does not drink alcohol or use illicit drugs. OB/GYN History:  OB History    Gravida Para Term Preterm AB Living   2 2 2   2    SAB TAB Ectopic Multiple Live Births             Allergies: has No Known Allergies. Medications:  Current Outpatient Prescriptions:  .  amoxicillin (AMOXIL) 500 MG tablet, Take 500 mg by mouth 2 (two) times daily., Disp: , Rfl:  .  baclofen (LIORESAL) 10 MG tablet, Take 10 mg by mouth 3 (three) times daily., Disp: , Rfl:  .  cholecalciferol (VITAMIN D3) 1,000 unit capsule, Take 1,000 Units by mouth once daily., Disp: , Rfl:  .  DIPHENHYDRAMINE HCL (BENADRYL ALLERGY ORAL), Take by mouth., Disp: , Rfl:  .  folic acid (FOLVITE) 800 MCG tablet, Take 400 mcg by mouth once daily., Disp: , Rfl:  .  levETIRAcetam (KEPPRA) 500 MG tablet, Take 1 tablet (500 mg total) by mouth 2 (two) times daily., Disp: 180  tablet, Rfl: 1 .  magnesium oxide (MAG-OX) 400 mg tablet, Take 400 mg by mouth once daily., Disp: , Rfl:  .  phenytoin (DILANTIN) 100 MG ER capsule, 200 mg in the morning and 300 mg at night, Disp: 150 capsule, Rfl: 5  Review of Systems: No SOB, no palpitations or chest pain, no new lower extremity edema, no nausea or vomiting or bowel or bladder complaints. See HPI for gyn specific ROS.   Exam:      Vitals:   11/11/15 1126  BP: 136/82  Pulse: 82    WDWN white female in NAD Body mass index is 35.12 kg/(m^2).  General: Patient is well-groomed, well-nourished, appears stated age in no acute distress  HEENT: head is atraumatic and normocephalic, trachea is midline, neck is supple with no palpable nodules  CV: Regular rhythm and normal heart rate, no murmur  Pulm: Clear to auscultation throughout lung fields with no wheezing, crackles, or rhonchi. No increased work of breathing  Abdomen: soft , no mass, non-tender, no rebound tenderness, no hepatomegaly  Pelvic: tanner stage 5 ,  Cervical stenosis present  Impression:   The primary encounter diagnosis was Abnormal uterine bleeding (AUB). A diagnosis of Cervical os stenosis was also pertinent to this visit.    Plan:   -  Preoperative visit: D&C hysteroscopy, pap smear and Novasure. Consents signed today. Risks  of surgery were discussed with the patient including but not limited to: bleeding which may require transfusion; infection which may require antibiotics; injury to uterus or surrounding organs; intrauterine scarring which may impair future fertility; need for additional procedures including laparotomy or laparoscopy; and other postoperative/anesthesia complications. Written informed consent was obtained.  This is a scheduled same-day surgery. She will have a postop visit in 2 weeks to review operative findings and pathology.  -  Return in about 4 weeks (around 12/09/2015) for Postop check.

## 2015-11-26 NOTE — Transfer of Care (Signed)
Immediate Anesthesia Transfer of Care Note  Patient: Tara Carlson  Procedure(s) Performed: Procedure(s): DILATATION & CURETTAGE/HYSTEROSCOPY WITH NOVASURE ABLATION (N/A)  Patient Location: PACU  Anesthesia Type:General  Level of Consciousness: sedated  Airway & Oxygen Therapy: Patient connected to face mask oxygen  Post-op Assessment: Post -op Vital signs reviewed and stable  Post vital signs: stable  Last Vitals:  Vitals:   11/26/15 0832 11/26/15 1404  BP: 126/79 125/84  Pulse: 89 84  Resp: 20   Temp: 36.8 C 36.4 C    Last Pain:  Vitals:   11/26/15 0832  TempSrc: Oral      Patients Stated Pain Goal: 2 (11/26/15 16100832)  Complications: No apparent anesthesia complications

## 2015-11-26 NOTE — Op Note (Signed)
Operative Report Hysteroscopy with Dilation and Curettage; Novasure ablation   Indications: Abnormal uterine bleeding-O, uncontrolled with medical management   Pre-operative Diagnosis:  1. Menometrorrhagia 2. Cervical stenosis   Post-operative Diagnosis: same.  Procedure: 1. Exam under anesthesia 2. Fractional D&C with endocervical curettage  3. Hysteroscopy 4. Novasure endometrial ablation  Surgeon: Cline Cools, MD  Assistant(s):  None  Anesthesia: General LMA anesthesia  Anesthesiologist: Yves Dill, MD Anesthesiologist: Yves Dill, MD CRNA: Irving Burton, CRNA; Lily Kocher, CRNA  Estimated Blood Loss:  Minimal         Intraoperative medications: intraop toradol         Total IV Fluids: 1100 ml  Urine Output: 50ml         Specimens: Endocervical curettings, endometrial curettings         Complications:  None; patient tolerated the procedure well.         Disposition: PACU - hemodynamically stable.         Condition: stable  Findings: Uterus measuring 10 cm by sound; normal cervix, vagina, perineum. Cervical length: 4.5 cm Uterine cavity length: 5.5 cm Uterine cavity width: 4.5 cm Power in watts: 136 Total time: 33seconds  Indication for procedure/Consents: 40 y.o. with prior BTL  here for scheduled surgery for the aforementioned diagnoses.  Risks of surgery were discussed with the patient including but not limited to: bleeding which may require transfusion; infection which may require antibiotics; injury to uterus or surrounding organs; intrauterine scarring which may impair future fertility; need for additional procedures including laparotomy or laparoscopy; and other postoperative/anesthesia complications. Written informed consent was obtained.    Procedure Details:   The patient was taken to the operating room where LMA anesthesia was administered and was found to be adequate. After a formal and adequate timeout was performed, she was  placed in the dorsal lithotomy position and examined with the above findings. She was then prepped and draped in the sterile manner. Her bladder was catheterized for an estimated amount of clear, yellow urine. A weighed speculum was then placed in the patient's vagina and a single tooth tenaculum was applied to the anterior lip of the cervix.  An endocervical currettage was performed. Her cervix was serially dilated to 15 [place] using Hanks dilators.The hysteroscope was introduced to reveal the above findings.The hysteroscope was also used to determine the level of the internal os, and measurements were confirmed. The uterine cavity was carefully examined, both ostia were recognized, and diffusely proliferative endometrium with polypoid fragments was noted.  A sharp curettage was then performed until there was a gritty texture in all four quadrants.   Bleeding was significant.  NOVASURE PROCEDURE DETAILS:   The cervix was further dilated to accommodate the NovaSure device.  The NovaSure device was inserted, and the cavity width was determined. Using a power of 136 watts, for 33 sec, the endometrial ablation was performed. The hysteroscope was not re-introduced into the uterine cavity, to decrease the risk of pelvic infection. The tenaculum was removed from the anterior lip of the cervix, and the vaginal speculum was removed after noting good hemostasis.  She received iv acetaminophen and Toradol prior to leaving the OR. The patient tolerated the procedure well and was taken to the recovery area awake, extubated and in stable condition.  The patient will be discharged to home as per PACU criteria.  Routine postoperative instructions given.  She was prescribed Percocet, Ibuprofen and Colace.  She will follow up in the clinic in two weeks for  postoperative evaluation.

## 2015-11-30 LAB — SURGICAL PATHOLOGY

## 2016-01-13 ENCOUNTER — Ambulatory Visit (INDEPENDENT_AMBULATORY_CARE_PROVIDER_SITE_OTHER): Payer: PRIVATE HEALTH INSURANCE | Admitting: Family Medicine

## 2016-01-13 ENCOUNTER — Encounter: Payer: Self-pay | Admitting: Family Medicine

## 2016-01-13 VITALS — BP 122/72 | HR 80 | Temp 98.1°F | Resp 16 | Wt 224.0 lb

## 2016-01-13 DIAGNOSIS — N939 Abnormal uterine and vaginal bleeding, unspecified: Secondary | ICD-10-CM | POA: Insufficient documentation

## 2016-01-13 DIAGNOSIS — L03031 Cellulitis of right toe: Secondary | ICD-10-CM | POA: Diagnosis not present

## 2016-01-13 MED ORDER — CEPHALEXIN 500 MG PO CAPS: 500.0000 mg | ORAL_CAPSULE | Freq: Two times a day (BID) | ORAL | 0 refills | Status: DC

## 2016-01-13 NOTE — Progress Notes (Signed)
Subjective:     Patient ID: Tara Carlson, female   DOB: 11-25-1975, 40 y.o.   MRN: [id number]  HPI  Chief Complaint  Patient presents with  . Toe Pain    Great toe of right foot. Had pedicure last Friday. Pedicurist was trying to remove an ingrown toenail, which caused pt to bleed. Pt has tried Neosporon, soaking the foot in salt water and epsom salt, with mild relief,     Review of Systems     Objective:   Physical Exam  Constitutional: She appears well-developed and well-nourished. No distress.  Skin:  Lateral aspect of right great toe with small amount of serous drainage, maceration and excess skin       Assessment:    1. Paronychia of great toe of right foot - cephALEXin (KEFLEX) 500 MG capsule; Take 1 capsule (500 mg total) by mouth 2 (two) times daily.  Dispense: 14 capsule; Refill: 0    Plan:    Continue salt water soaks daily and remove excess skin once on abx.

## 2016-01-13 NOTE — Patient Instructions (Signed)
Continue daily Epsom Salt soaks. May pull off excess skin after being on the antibiotic. Let us know if not improving.

## 2016-05-09 ENCOUNTER — Encounter: Payer: Self-pay | Admitting: Physician Assistant

## 2016-05-09 ENCOUNTER — Ambulatory Visit (INDEPENDENT_AMBULATORY_CARE_PROVIDER_SITE_OTHER): Payer: PRIVATE HEALTH INSURANCE | Admitting: Physician Assistant

## 2016-05-09 VITALS — BP 138/76 | HR 84 | Temp 98.7°F | Resp 16 | Wt 207.0 lb

## 2016-05-09 DIAGNOSIS — L0291 Cutaneous abscess, unspecified: Secondary | ICD-10-CM | POA: Diagnosis not present

## 2016-05-09 MED ORDER — DOXYCYCLINE HYCLATE 100 MG PO TABS: 100.0000 mg | ORAL_TABLET | Freq: Two times a day (BID) | ORAL | 0 refills | Status: AC

## 2016-05-09 NOTE — Patient Instructions (Signed)
Absceso cutáneo  (Skin Abscess)  Un absceso cutáneo es una zona infectada en la piel o debajo de esta que contiene pus y otras sustancias. Un absceso puede aparecer casi en cualquier lugar del cuerpo. Algunos abscesos se abren (rompen) solos. La mayoría de ellos siguen empeorando, a menos que se los trate. La infección puede diseminarse hacia otros sitios del cuerpo y en la sangre, lo que puede causar sensación de malestar. Generalmente, el tratamiento consiste en el drenaje del absceso.  CUIDADOS EN EL HOGAR  Cuidado del absceso  · Si tiene un absceso que no ha supurado, coloque sobre este un paño húmedo, tibio y limpio varias veces por día. Hágalo como se lo haya indicado el médico.  · Siga las indicaciones del médico en lo que respecta al cuidado del absceso. Asegúrese de lo siguiente:  ? Cubra el absceso con una venda (vendaje).  ? Cambie la venda o la gasa como se lo haya indicado el médico.  ? Lávese las manos con agua y jabón antes de cambiar el vendaje o la gasa. Use un desinfectante para manos si no dispone de agua y jabón.  · Contrólese el absceso todos los días para detectar si la infección empeora. Esté atento a los siguientes signos:  ? Aumento del enrojecimiento, de la hinchazón o del dolor.  ? Más líquido o sangre.  ? Calor.  ? Mal olor o aumento del pus.  Medicamentos  · Tome los medicamentos de venta libre y los recetados solamente como se lo haya indicado el médico.  · Si le recetaron un antibiótico, tómelo como se lo haya indicado el médico. No deje de tomar los antibióticos aunque comience a sentirse mejor.  Instrucciones generales  · Para evitar la propagación de la infección:  ? No comparta artículos de higiene personal, toallas o jacuzzis con otras personas.  ? Evite el contacto con la piel de otras personas.  · Concurra a todas las visitas de control como se lo haya indicado el médico. Esto es importante.  SOLICITE AYUDA SI:  · Aumentan el enrojecimiento, la hinchazón o el dolor alrededor del  absceso.  · Aumenta la cantidad de líquido o de sangre que sale del absceso.  · Siente el absceso caliente cuando lo toca.  · Aumenta la cantidad de pus o percibe mal olor que sale del absceso.  · Tiene fiebre.  · Tiene dolor muscular.  · Tiene escalofríos.  · Se siente mal.    SOLICITE AYUDA DE INMEDIATO SI:  · Siente mucho dolor (intenso).  · Observa líneas rojas que se extienden desde el absceso.    Esta información no tiene como fin reemplazar el consejo del médico. Asegúrese de hacerle al médico cualquier pregunta que tenga.  Document Released: 05/12/2008 Document Revised: 08/15/2011 Document Reviewed: 12/23/2014  [employer]vier Interactive Patient Education © 2017 [employer]

## 2016-05-09 NOTE — Progress Notes (Signed)
Patient: Tara Carlson Female    DOB: 08-Aug-1975   41 y.o.   MRN: [id number] Visit Date: 05/09/2016  Today's Provider: Trey Sailors, PA-C   No chief complaint on file.  Subjective:    HPI  Abscess: Patient is a 41 y/o woman with seizure disorder on Dilantin and Keppra who presents for evaluation of a cutaneous abscess. Lesion is located in the Right arm near her elbow. Onset was 2 weeks ago. Symptoms have progressed to a point and plateaued. Abscess has associated symptoms of pain. Her father lanced the lesion and not much drained out. Has been doing warm compresses with some temporary relief. Patient does not have previous history of cutaneous abscesses. Patient does not have diabetes. No injuries, no IVDU.  No Known Allergies   Current Outpatient Prescriptions:  .  acetaminophen (TYLENOL) 500 MG tablet, Take 500 mg by mouth every 8 (eight) hours as needed., Disp: , Rfl:  .  baclofen (LIORESAL) 10 MG tablet, Take 10 mg by mouth 2 (two) times daily as needed for muscle spasms., Disp: , Rfl:  .  cholecalciferol (VITAMIN D) 1000 units tablet, Take 1,000 Units by mouth daily., Disp: , Rfl:  .  docusate sodium (COLACE) 100 MG capsule, Take 1 capsule (100 mg total) by mouth daily as needed for mild constipation., Disp: 60 capsule, Rfl: 3 .  folic acid (FOLVITE) 800 MCG tablet, Take 800 mcg by mouth daily., Disp: , Rfl:  .  levETIRAcetam (KEPPRA) 500 MG tablet, Take 500 mg by mouth 2 (two) times daily., Disp: , Rfl:  .  magnesium oxide (MAG-OX) 400 MG tablet, Take 400 mg by mouth daily., Disp: , Rfl:  .  phenytoin (DILANTIN) 100 MG ER capsule, take 2 capsules by mouth every morning AND 3 CAPSULES EVERY EVENING, Disp: 150 capsule, Rfl: 6  Review of Systems  Constitutional: Negative.   Musculoskeletal: Positive for arthralgias, joint swelling and myalgias. Negative for back pain, gait problem, neck pain and neck stiffness.  Neurological: Negative for dizziness,  light-headedness and headaches.    Social History  Substance Use Topics  . Smoking status: Never Smoker  . Smokeless tobacco: Never Used  . Alcohol use No   Objective:   BP 138/76 (BP Location: Left Arm, Patient Position: Sitting, Cuff Size: Normal)   Pulse 84   Temp 98.7 F (37.1 C) (Oral)   Resp 16   Wt 207 lb (93.9 kg)   LMP 05/02/2016   BMI 31.47 kg/m  Vitals:   05/09/16 1340  BP: 138/76  Pulse: 84  Resp: 16  Temp: 98.7 F (37.1 C)  TempSrc: Oral  Weight: 207 lb (93.9 kg)     Physical Exam  Constitutional: She is oriented to person, place, and time. She appears well-developed and well-nourished.  Cardiovascular: Normal rate.   Pulmonary/Chest: Effort normal.  Musculoskeletal: Normal range of motion.  Neurological: She is alert and oriented to person, place, and time.  Skin: Skin is warm and dry. Lesion and rash noted. There is erythema.           Assessment & Plan:     1. Abscess  Will treat as below. Ran drug interaction screen with Keppra and Dilantin on Micromedex, only interaction is that doxycycline might be less effective. Can extend course if need be. Instructed on continuation of hot compresses and salt soaks. Instructed to call back or return if worsening.   - doxycycline (VIBRA-TABS) 100 MG tablet; Take 1 tablet (100  mg total) by mouth 2 (two) times daily.  Dispense: 14 tablet; Refill: 0  Return if symptoms worsen or fail to improve.  The entirety of the information documented in the History of Present Illness, Review of Systems and Physical Exam were personally obtained by me. Portions of this information were initially documented by Kavin LeechLaura Marzell Isakson, CMA and reviewed by me for thoroughness and accuracy.        Trey SailorsAdriana M Pollak, PA-C  [facility]

## 2016-12-18 ENCOUNTER — Other Ambulatory Visit: Payer: Self-pay | Admitting: Obstetrics and Gynecology

## 2016-12-18 DIAGNOSIS — Z1231 Encounter for screening mammogram for malignant neoplasm of breast: Secondary | ICD-10-CM

## 2017-07-06 ENCOUNTER — Emergency Department: Payer: 59

## 2017-07-06 ENCOUNTER — Emergency Department
Admission: EM | Admit: 2017-07-06 | Discharge: 2017-07-07 | Disposition: A | Discharge status: Home / Self Care | Payer: 59 | Attending: Emergency Medicine | Admitting: Emergency Medicine

## 2017-07-06 ENCOUNTER — Encounter: Payer: Self-pay | Admitting: Physician Assistant

## 2017-07-06 ENCOUNTER — Ambulatory Visit: Payer: 59 | Admitting: Physician Assistant

## 2017-07-06 VITALS — BP 122/72 | HR 88 | Temp 98.7°F | Resp 16 | Wt 183.0 lb

## 2017-07-06 DIAGNOSIS — M791 Myalgia, unspecified site: Secondary | ICD-10-CM

## 2017-07-06 DIAGNOSIS — R110 Nausea: Secondary | ICD-10-CM | POA: Insufficient documentation

## 2017-07-06 DIAGNOSIS — G40909 Epilepsy, unspecified, not intractable, without status epilepticus: Secondary | ICD-10-CM

## 2017-07-06 DIAGNOSIS — R5383 Other fatigue: Secondary | ICD-10-CM | POA: Diagnosis not present

## 2017-07-06 DIAGNOSIS — R569 Unspecified convulsions: Secondary | ICD-10-CM | POA: Diagnosis present

## 2017-07-06 DIAGNOSIS — R51 Headache: Secondary | ICD-10-CM | POA: Insufficient documentation

## 2017-07-06 DIAGNOSIS — Z79899 Other long term (current) drug therapy: Secondary | ICD-10-CM | POA: Insufficient documentation

## 2017-07-06 DIAGNOSIS — W57XXXA Bitten or stung by nonvenomous insect and other nonvenomous arthropods, initial encounter: Secondary | ICD-10-CM | POA: Diagnosis not present

## 2017-07-06 DIAGNOSIS — R509 Fever, unspecified: Secondary | ICD-10-CM | POA: Diagnosis not present

## 2017-07-06 LAB — COMPREHENSIVE METABOLIC PANEL
ALBUMIN: 3.9 g/dL (ref 3.5–5.0)
ALT: 20 U/L (ref 14–54)
ANION GAP: 13 (ref 5–15)
AST: 47 U/L — AB (ref 15–41)
Alkaline Phosphatase: 99 U/L (ref 38–126)
BUN: 10 mg/dL (ref 6–20)
CALCIUM: 8.4 mg/dL — AB (ref 8.9–10.3)
CO2: 18 mmol/L — AB (ref 22–32)
Chloride: 98 mmol/L — ABNORMAL LOW (ref 101–111)
Creatinine, Ser: 0.73 mg/dL (ref 0.44–1.00)
GFR calc non Af Amer: >60 mL/min (ref >60)
GLUCOSE: 187 mg/dL — AB (ref 65–99)
POTASSIUM: 3.4 mmol/L — AB (ref 3.5–5.1)
Sodium: 129 mmol/L — ABNORMAL LOW (ref 135–145)
Total Bilirubin: 0.6 mg/dL (ref 0.3–1.2)
Total Protein: 6.9 g/dL (ref 6.5–8.1)

## 2017-07-06 LAB — CBC WITH DIFFERENTIAL/PLATELET
BASOS ABS: 0 10*3/uL (ref 0–0.1)
BASOS PCT: 0 %
Eosinophils Absolute: 0 10*3/uL (ref 0–0.7)
Eosinophils Relative: 0 %
HEMATOCRIT: 38.2 % (ref 35.0–47.0)
Hemoglobin: 13 g/dL (ref 12.0–16.0)
LYMPHS PCT: 11 %
Lymphs Abs: 0.7 10*3/uL — ABNORMAL LOW (ref 1.0–3.6)
MCH: 31.9 pg (ref 26.0–34.0)
MCHC: 34.1 g/dL (ref 32.0–36.0)
MCV: 93.7 fL (ref 80.0–100.0)
MONOS PCT: 8 %
Monocytes Absolute: 0.5 10*3/uL (ref 0.2–0.9)
NEUTROS ABS: 5.2 10*3/uL (ref 1.4–6.5)
NEUTROS PCT: 81 %
Platelets: 225 10*3/uL (ref 150–440)
RBC: 4.07 MIL/uL (ref 3.80–5.20)
RDW: 12.5 % (ref 11.5–14.5)
WBC: 6.4 10*3/uL (ref 3.6–11.0)

## 2017-07-06 LAB — PHENYTOIN LEVEL, TOTAL: Phenytoin Lvl: 8 ug/mL — ABNORMAL LOW (ref 10.0–20.0)

## 2017-07-06 MED ORDER — METOCLOPRAMIDE HCL 5 MG/ML IJ SOLN
10.0000 mg | Freq: Once | INTRAMUSCULAR | Status: DC
  Filled 2017-07-06: qty 2

## 2017-07-06 MED ORDER — SODIUM CHLORIDE 0.9 % IV SOLN
1000.0000 mg | Freq: Once | INTRAVENOUS | Status: AC
  Administered 2017-07-06: 1000 mg via INTRAVENOUS
  Filled 2017-07-06: qty 20

## 2017-07-06 MED ORDER — SODIUM CHLORIDE 0.9 % IV BOLUS
1000.0000 mL | Freq: Once | INTRAVENOUS | Status: AC
  Administered 2017-07-06: 1000 mL via INTRAVENOUS

## 2017-07-06 MED ORDER — DOXYCYCLINE HYCLATE 100 MG PO TABS: 100.0000 mg | ORAL_TABLET | Freq: Two times a day (BID) | ORAL | 0 refills | Status: AC

## 2017-07-06 MED ORDER — ACETAMINOPHEN 325 MG PO TABS
650.0000 mg | ORAL_TABLET | Freq: Once | ORAL | Status: AC
  Administered 2017-07-06: 650 mg via ORAL
  Filled 2017-07-06: qty 2

## 2017-07-06 NOTE — ED Triage Notes (Signed)
Patient had witnessed seizure at [facility] at approx 2100. EMS called out, patient refused transport. Patient had a second witnessed seizure, and EMS again called. Patient actively seizing at EMS arrival. Patient seized for 30-45 seconds in front of EMS.   Patient on antibiotics for tic born illness per EMS.   Patient given  versed IV by EMS prior to arrival.
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
## 2017-07-06 NOTE — Progress Notes (Signed)
Patient: Tara Carlson Female    DOB: 28-Apr-1975   42 y.o.   MRN: [id number] Visit Date: 07/06/2017  Today's Provider: Trey Sailors, PA-C   Chief Complaint  Patient presents with  . Emesis    Started this morning   Subjective:    Tara Carlson is a 42 y/o woman with history of epilepsy presenting today with malaise, myalgia, after tick bite. She is currently on Keppra and Dilantin for her seizures. She may have missed a dose according to her husband but she reports she took it. She says she feels very poorly. No fevers, no seizure activity. She reports pulling ticks off her one week ago. No nausea, vomiting, abdominal pain. No diarrhea. No neck stiffness.   Emesis   This is a new problem. The current episode started today. The problem has been unchanged. Associated symptoms include abdominal pain, arthralgias, diarrhea, headaches and myalgias. Pertinent negatives include no chest pain, chills, coughing, dizziness, fever, sweats, URI or weight loss.       No Known Allergies   Current Outpatient Medications:  .  cholecalciferol (VITAMIN D) 1000 units tablet, Take 1,000 Units by mouth daily., Disp: , Rfl:  .  docusate sodium (COLACE) 100 MG capsule, Take 1 capsule (100 mg total) by mouth daily as needed for mild constipation., Disp: 60 capsule, Rfl: 3 .  ferrous sulfate 325 (65 FE) MG EC tablet, Take 325 mg by mouth daily with breakfast., Disp: , Rfl:  .  folic acid (FOLVITE) 800 MCG tablet, Take 800 mcg by mouth daily., Disp: , Rfl:  .  levETIRAcetam (KEPPRA) 500 MG tablet, Take 500 mg by mouth 2 (two) times daily., Disp: , Rfl:  .  magnesium oxide (MAG-OX) 400 MG tablet, Take 400 mg by mouth daily., Disp: , Rfl:  .  phenytoin (DILANTIN) 100 MG ER capsule, take 2 capsules by mouth every morning AND 3 CAPSULES EVERY EVENING (Patient taking differently: 1 Capsule in the AM and 3 in the Evening.), Disp: 150 capsule, Rfl: 6 .  acetaminophen (TYLENOL) 500 MG tablet,  Take 500 mg by mouth every 8 (eight) hours as needed., Disp: , Rfl:  .  baclofen (LIORESAL) 10 MG tablet, Take 10 mg by mouth 2 (two) times daily as needed for muscle spasms., Disp: , Rfl:   Review of Systems  Constitutional: Positive for fatigue. Negative for chills, fever and weight loss.  Respiratory: Negative.  Negative for cough.   Cardiovascular: Negative for chest pain.  Gastrointestinal: Positive for abdominal pain, diarrhea and vomiting. Negative for abdominal distention, anal bleeding, blood in stool, constipation, nausea and rectal pain.  Musculoskeletal: Positive for arthralgias and myalgias. Negative for back pain, gait problem, joint swelling, neck pain and neck stiffness.  Neurological: Positive for headaches. Negative for dizziness and light-headedness.    Social History   Tobacco Use  . Smoking status: Never Smoker  . Smokeless tobacco: Never Used  Substance Use Topics  . Alcohol use: No   Objective:   BP 122/72 (BP Location: Right Arm, Patient Position: Supine, Cuff Size: Normal)   Pulse 88   Temp 98.7 F (37.1 C) (Oral)   Resp 16   Wt 183 lb (83 kg)   BMI 27.83 kg/m  Vitals:   07/06/17 1505  BP: 122/72  Pulse: 88  Resp: 16  Temp: 98.7 F (37.1 C)  TempSrc: Oral  Weight: 183 lb (83 kg)     Physical Exam  Constitutional: She is oriented  to person, place, and time. She appears well-developed and well-nourished.  Eyes: Pupils are equal, round, and reactive to light. Conjunctivae are normal.  Neck: Normal range of motion and full passive range of motion without pain. Neck supple. No neck rigidity. No edema present.  Cardiovascular: Normal rate and regular rhythm.  Pulmonary/Chest: Effort normal and breath sounds normal.  Abdominal: Soft. Bowel sounds are normal.  Neurological: She is alert and oriented to person, place, and time.  Skin: Skin is warm and dry. No rash noted.  Psychiatric: She has a normal mood and affect. Her behavior is normal.         Assessment & Plan:     1. Tick bite, initial encounter  Patient declines labs today to check for tick borne illness. Will treat empirically for tick borne illness. Follow up 3 days.   - doxycycline (VIBRA-TABS) 100 MG tablet; Take 1 tablet (100 mg total) by mouth 2 (two) times daily for 10 days.  Dispense: 20 tablet; Refill: 0  2. Other fatigue  - doxycycline (VIBRA-TABS) 100 MG tablet; Take 1 tablet (100 mg total) by mouth 2 (two) times daily for 10 days.  Dispense: 20 tablet; Refill: 0  3. Myalgia  - doxycycline (VIBRA-TABS) 100 MG tablet; Take 1 tablet (100 mg total) by mouth 2 (two) times daily for 10 days.  Dispense: 20 tablet; Refill: 0  4. Nausea  - doxycycline (VIBRA-TABS) 100 MG tablet; Take 1 tablet (100 mg total) by mouth 2 (two) times daily for 10 days.  Dispense: 20 tablet; Refill: 0  5. Seizure disorder [facility])  May have missed dose of medication.  Return in about 3 days (around 07/09/2017) for tick bite.  The entirety of the information documented in the History of Present Illness, Review of Systems and Physical Exam were personally obtained by me. Portions of this information were initially documented by Kavin Leech, CMA and reviewed by me for thoroughness and accuracy.           Trey Sailors, PA-C  [facility]

## 2017-07-06 NOTE — ED Notes (Signed)
Patient reports headache resolved, and is refusing reglan at the time.

## 2017-07-06 NOTE — Patient Instructions (Signed)

## 2017-07-07 LAB — BASIC METABOLIC PANEL
Anion gap: 6 (ref 5–15)
BUN: 8 mg/dL (ref 6–20)
CO2: 25 mmol/L (ref 22–32)
Calcium: 7.9 mg/dL — ABNORMAL LOW (ref 8.9–10.3)
Chloride: 100 mmol/L — ABNORMAL LOW (ref 101–111)
Creatinine, Ser: 0.5 mg/dL (ref 0.44–1.00)
GFR calc Af Amer: >60 mL/min (ref >60)
GFR calc non Af Amer: >60 mL/min (ref >60)
GLUCOSE: 120 mg/dL — AB (ref 65–99)
POTASSIUM: 3.6 mmol/L (ref 3.5–5.1)
Sodium: 131 mmol/L — ABNORMAL LOW (ref 135–145)

## 2017-07-07 NOTE — ED Provider Notes (Signed)
at this point patient's mental status seems to be back to normal.  Her sodium is still pending we will get that back if it's okay plan on discharging her.  upon discharge sodium is more normal patient is acting within normal limits. her calcium is now slightly low I will give her some Tums by mouth will let her go.   Arnaldo Natal, MD 07/07/17 [phone]

## 2017-07-07 NOTE — ED Notes (Signed)
Reviewed discharge instructions, follow-up care, and medications with patient. Patient verbalized understanding of all information reviewed. Patient stable, with no distress noted at this time.    

## 2017-07-07 NOTE — Discharge Instructions (Addendum)
Please continue all your medications including antibiotics. Please return for any further problems and follow-up with your regular doctor within the next 3-4 days. here calcium level was somewhat low here in the emergency room. Please take one Tums 3 times a day for the next week. Have your doctor check this next week.

## 2017-07-09 ENCOUNTER — Ambulatory Visit: Payer: PRIVATE HEALTH INSURANCE | Admitting: Physician Assistant

## 2017-07-10 LAB — ROCKY MTN SPOTTED FVR ABS PNL(IGG+IGM)
RMSF IGM: 0.58 {index} (ref 0.00–0.89)
RMSF IgG: POSITIVE — AB

## 2017-07-10 LAB — RMSF, IGG, IFA: RMSF, IGG, IFA: 1:64 {titer} — ABNORMAL HIGH

## 2017-07-10 LAB — LYME DISEASE DNA BY PCR(BORRELIA BURG): Lyme Disease(B.burgdorferi)PCR: NEGATIVE

## 2017-07-11 LAB — CULTURE, BLOOD (ROUTINE X 2)
Culture: NO GROWTH
Culture: NO GROWTH

## 2017-07-12 ENCOUNTER — Other Ambulatory Visit: Payer: Self-pay | Admitting: Physician Assistant

## 2017-07-12 ENCOUNTER — Telehealth: Payer: Self-pay | Admitting: Emergency Medicine

## 2017-07-12 NOTE — Telephone Encounter (Signed)
Called patient to inform of rmsf results.  She should be already taking doxycycline from [facility] and should follow up with them as well.  Left message.

## 2017-11-30 ENCOUNTER — Other Ambulatory Visit: Payer: Self-pay | Admitting: Physician Assistant

## 2017-11-30 DIAGNOSIS — R110 Nausea: Secondary | ICD-10-CM

## 2017-11-30 DIAGNOSIS — W57XXXA Bitten or stung by nonvenomous insect and other nonvenomous arthropods, initial encounter: Secondary | ICD-10-CM

## 2017-11-30 DIAGNOSIS — M791 Myalgia, unspecified site: Secondary | ICD-10-CM

## 2017-11-30 DIAGNOSIS — R5383 Other fatigue: Secondary | ICD-10-CM

## 2018-04-20 ENCOUNTER — Other Ambulatory Visit: Payer: Self-pay

## 2018-04-20 ENCOUNTER — Emergency Department
Admission: EM | Admit: 2018-04-20 | Discharge: 2018-04-20 | Disposition: A | Discharge status: Home / Self Care | Payer: 59 | Attending: Student in an Organized Health Care Education/Training Program | Admitting: Student in an Organized Health Care Education/Training Program

## 2018-04-20 ENCOUNTER — Encounter: Payer: Self-pay | Admitting: Emergency Medicine

## 2018-04-20 DIAGNOSIS — Z79899 Other long term (current) drug therapy: Secondary | ICD-10-CM | POA: Diagnosis not present

## 2018-04-20 DIAGNOSIS — R569 Unspecified convulsions: Secondary | ICD-10-CM | POA: Insufficient documentation

## 2018-04-20 LAB — COMPREHENSIVE METABOLIC PANEL
ALT: 23 U/L (ref 0–44)
AST: 25 U/L (ref 15–41)
Albumin: 3.9 g/dL (ref 3.5–5.0)
Alkaline Phosphatase: 87 U/L (ref 38–126)
Anion gap: 7 (ref 5–15)
BUN: 9 mg/dL (ref 6–20)
CO2: 25 mmol/L (ref 22–32)
Calcium: 8.6 mg/dL — ABNORMAL LOW (ref 8.9–10.3)
Chloride: 104 mmol/L (ref 98–111)
Creatinine, Ser: 0.48 mg/dL (ref 0.44–1.00)
GFR calc Af Amer: >60 mL/min (ref >60)
GFR calc non Af Amer: >60 mL/min (ref >60)
Glucose, Bld: 184 mg/dL — ABNORMAL HIGH (ref 70–99)
POTASSIUM: 4 mmol/L (ref 3.5–5.1)
Sodium: 136 mmol/L (ref 135–145)
Total Bilirubin: 0.4 mg/dL (ref 0.3–1.2)
Total Protein: 6.9 g/dL (ref 6.5–8.1)

## 2018-04-20 LAB — CBC
HCT: 40.2 % (ref 36.0–46.0)
Hemoglobin: 13.4 g/dL (ref 12.0–15.0)
MCH: 31.8 pg (ref 26.0–34.0)
MCHC: 33.3 g/dL (ref 30.0–36.0)
MCV: 95.3 fL (ref 80.0–100.0)
Platelets: 184 10*3/uL (ref 150–400)
RBC: 4.22 MIL/uL (ref 3.87–5.11)
RDW: 11.9 % (ref 11.5–15.5)
WBC: 4.7 10*3/uL (ref 4.0–10.5)
nRBC: 0 % (ref 0.0–0.2)

## 2018-04-20 LAB — PHENYTOIN LEVEL, TOTAL: Phenytoin Lvl: 6.3 ug/mL — ABNORMAL LOW (ref 10.0–20.0)

## 2018-04-20 MED ORDER — LEVETIRACETAM IN NACL 1000 MG/100ML IV SOLN
1000.0000 mg | Freq: Once | INTRAVENOUS | Status: DC
  Filled 2018-04-20: qty 100

## 2018-04-20 MED ORDER — LEVETIRACETAM IN NACL 1500 MG/100ML IV SOLN
1500.0000 mg | Freq: Once | INTRAVENOUS | Status: DC
  Administered 2018-04-20: 1500 mg via INTRAVENOUS
  Filled 2018-04-20 (×2): qty 100

## 2018-04-20 MED ORDER — LEVETIRACETAM 250 MG PO TABS: 250.0000 mg | ORAL_TABLET | Freq: Two times a day (BID) | ORAL | 0 refills | Status: DC

## 2018-04-20 NOTE — ED Notes (Signed)
Report received. Pt came in with seizure and is to get keppra when pharmacy sends it. Otherwise alert and oriented

## 2018-04-20 NOTE — ED Triage Notes (Signed)
First Nurse NOte:  Patient had seizure about one hour PTA.  Patient has history of seizures, takes Dilantin and Keppra.  States has not taken meds this morning.  Last seizure was about 2 years ago.  Husband states Dilantin level was low at that time.   Patient is AAOx3.  Skin skin warm and dry. NAD
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
## 2018-04-20 NOTE — ED Triage Notes (Signed)
Seizure approx 1 hour ago. History of seizures due to TBI, last seizure 2 years ago.

## 2018-04-20 NOTE — Discharge Instructions (Addendum)
Please follow-up with Dr. Sherryll Burger.  Please start taking 750 mg Keppra twice daily.  You can either break your 500 mg tablets in half and take 1-1/2 tablets twice daily or I have also sent a prescription for 250 mg pills to your pharmacy.  Please follow-up with Dr. Clelia Croft as soon as possible.  Return to the ER for any additional questions or concerns.

## 2018-10-24 ENCOUNTER — Other Ambulatory Visit: Payer: Self-pay

## 2018-10-24 ENCOUNTER — Encounter
Admission: RE | Admit: 2018-10-24 | Discharge: 2018-10-24 | Disposition: A | Discharge status: Home / Self Care | Payer: 59 | Source: Ambulatory Visit | Attending: Orthopedic Surgery | Admitting: Orthopedic Surgery

## 2018-10-24 DIAGNOSIS — Z20828 Contact with and (suspected) exposure to other viral communicable diseases: Secondary | ICD-10-CM | POA: Insufficient documentation

## 2018-10-24 DIAGNOSIS — M1611 Unilateral primary osteoarthritis, right hip: Secondary | ICD-10-CM | POA: Diagnosis not present

## 2018-10-24 DIAGNOSIS — Z01812 Encounter for preprocedural laboratory examination: Secondary | ICD-10-CM | POA: Insufficient documentation

## 2018-10-24 HISTORY — DX: Unspecified osteoarthritis, unspecified site: M19.90

## 2018-10-24 LAB — BASIC METABOLIC PANEL
Anion gap: 6 (ref 5–15)
BUN: 12 mg/dL (ref 6–20)
CO2: 29 mmol/L (ref 22–32)
Calcium: 8.9 mg/dL (ref 8.9–10.3)
Chloride: 104 mmol/L (ref 98–111)
Creatinine, Ser: 0.7 mg/dL (ref 0.44–1.00)
GFR calc Af Amer: >60 mL/min (ref >60)
GFR calc non Af Amer: >60 mL/min (ref >60)
Glucose, Bld: 92 mg/dL (ref 70–99)
Potassium: 4.2 mmol/L (ref 3.5–5.1)
Sodium: 139 mmol/L (ref 135–145)

## 2018-10-24 LAB — APTT: aPTT: 30 seconds (ref 24–36)

## 2018-10-24 LAB — URINALYSIS, ROUTINE W REFLEX MICROSCOPIC
Bacteria, UA: NONE SEEN
Bilirubin Urine: NEGATIVE
Glucose, UA: NEGATIVE mg/dL
Hgb urine dipstick: NEGATIVE
Ketones, ur: NEGATIVE mg/dL
Nitrite: NEGATIVE
Protein, ur: NEGATIVE mg/dL
Specific Gravity, Urine: 1.005 (ref 1.005–1.030)
pH: 7 (ref 5.0–8.0)

## 2018-10-24 LAB — CBC
HCT: 37.2 % (ref 36.0–46.0)
Hemoglobin: 12.5 g/dL (ref 12.0–15.0)
MCH: 31.4 pg (ref 26.0–34.0)
MCHC: 33.6 g/dL (ref 30.0–36.0)
MCV: 93.5 fL (ref 80.0–100.0)
Platelets: 215 10*3/uL (ref 150–400)
RBC: 3.98 MIL/uL (ref 3.87–5.11)
RDW: 11.9 % (ref 11.5–15.5)
WBC: 3.5 10*3/uL — ABNORMAL LOW (ref 4.0–10.5)
nRBC: 0 % (ref 0.0–0.2)

## 2018-10-24 LAB — TYPE AND SCREEN
ABO/RH(D): O NEG
Antibody Screen: NEGATIVE

## 2018-10-24 LAB — SURGICAL PCR SCREEN
MRSA, PCR: NEGATIVE
Staphylococcus aureus: NEGATIVE

## 2018-10-24 LAB — PROTIME-INR
INR: 1 (ref 0.8–1.2)
Prothrombin Time: 13 s (ref 11.4–15.2)

## 2018-10-24 LAB — SEDIMENTATION RATE: Sed Rate: 3 mm/hr (ref 0–20)

## 2018-10-24 NOTE — Patient Instructions (Addendum)
Your procedure is scheduled on: 10-29-18 TUESDAY Report to Same Day Surgery 2nd floor medical mall [facility](Medical Mall Entrance-take elevator on left to 2nd floor.  Check in with surgery information desk.) To find out your arrival time please call [phone] between 1PM - 3PM on 10-28-18 MONDAY  Remember: Instructions that are not followed completely may result in serious medical risk, up to and including death, or upon the discretion of your surgeon and anesthesiologist your surgery may need to be rescheduled.    _x___ 1. Do not eat food after midnight the night before your procedure. NO GUM OR CANDY AFTER MIDNIGHT. You may drink clear liquids up to 2 hours before you are scheduled to arrive at the hospital for your procedure.  Do not drink clear liquids within 2 hours of your scheduled arrival to the hospital.  Clear liquids include  --Water or Apple juice without pulp  --Clear carbohydrate beverage such as ClearFast or Gatorade  --Black Coffee or Clear Tea (No milk, no creamers, do not add anything to the coffee or Tea   ____Ensure clear carbohydrate drink on the way to the hospital for bariatric patients  ____Ensure clear carbohydrate drink 3 hours before surgery.    __x__ 2. No Alcohol for 24 hours before or after surgery.   __x__3. No Smoking or e-cigarettes for 24 prior to surgery.  Do not use any chewable tobacco products for at least 6 hour prior to surgery   ____  4. Bring all medications with you on the day of surgery if instructed.    __x__ 5. Notify your doctor if there is any change in your medical condition     (cold, fever, infections).    x___6. On the morning of surgery brush your teeth with toothpaste and water.  You may rinse your mouth with mouth wash if you wish.  Do not swallow any toothpaste or mouthwash.   Do not wear jewelry, make-up, hairpins, clips or nail polish.  Do not wear lotions, powders, or perfumes. You may wear deodorant.  Do not shave 48 hours prior  to surgery. Men may shave face and neck.  Do not bring valuables to the hospital.    [facility] not responsible for any belongings or valuables.               Contacts, dentures or bridgework may not be worn into surgery.  Leave your suitcase in the car. After surgery it may be brought to your room.  For patients admitted to the hospital, discharge time is determined by your  treatment team.  _  Patients discharged the day of surgery will not be allowed to drive home.  You will need someone to drive you home and stay with you the night of your procedure.    Please read over the following fact sheets that you were given:   [facility] Preparing for Surgery and or MRSA Information   _x___ TAKE THE FOLLOWING MEDICATION THE MORNING OF SURGERY WITH A SMALL SIP OF WATER. These include:  1. KEPPRA (LEVETIRACETAM)  2. MAGNESIUM OXIDE  3. DILANTIN (PHENYTOIN)  4.  5.  6.  ____Fleets enema or Magnesium Citrate as directed.   _x___ Use CHG Soap or sage wipes as directed on instruction sheet   ____ Use inhalers on the day of surgery and bring to hospital day of surgery  ____ Stop Metformin and Janumet 2 days prior to surgery.    ____ Take 1/2 of usual insulin dose the night  before surgery and none on the morning surgery.   ____ Follow recommendations from Cardiologist, Pulmonologist or PCP regarding stopping Aspirin, Coumadin, Plavix ,Eliquis, Effient, or Pradaxa, and Pletal.  X____Stop Anti-inflammatories such as Advil, Aleve, Ibuprofen, Motrin, Naproxen,LODINE (ETODOLAC) Naprosyn, Goodies powders or aspirin products NOW-OK to take Tylenol    ____ Stop supplements until after surgery.    ____ Bring C-Pap to the hospital.

## 2018-10-25 ENCOUNTER — Other Ambulatory Visit: Payer: Self-pay

## 2018-10-25 ENCOUNTER — Other Ambulatory Visit
Admission: RE | Admit: 2018-10-25 | Discharge: 2018-10-25 | Disposition: A | Discharge status: Home / Self Care | Payer: 59 | Source: Ambulatory Visit | Attending: Orthopedic Surgery | Admitting: Orthopedic Surgery

## 2018-10-25 DIAGNOSIS — Z01812 Encounter for preprocedural laboratory examination: Secondary | ICD-10-CM | POA: Diagnosis not present

## 2018-10-25 LAB — URINE CULTURE: Culture: 50000 — AB

## 2018-10-25 LAB — SARS CORONAVIRUS 2 (TAT 6-24 HRS): SARS Coronavirus 2: NEGATIVE

## 2018-10-28 MED ORDER — TRANEXAMIC ACID-NACL 1000-0.7 MG/100ML-% IV SOLN
1000.0000 mg | INTRAVENOUS | Status: AC
  Administered 2018-10-29: 1000 mg via INTRAVENOUS
  Filled 2018-10-28: qty 100

## 2018-10-28 MED ORDER — CEFAZOLIN SODIUM-DEXTROSE 2-4 GM/100ML-% IV SOLN
2.0000 g | Freq: Once | INTRAVENOUS | Status: AC
  Administered 2018-10-29: 2 g via INTRAVENOUS

## 2018-10-29 ENCOUNTER — Inpatient Hospital Stay: Payer: 59 | Admitting: Anesthesiology

## 2018-10-29 ENCOUNTER — Encounter: Payer: Self-pay | Admitting: *Deleted

## 2018-10-29 ENCOUNTER — Inpatient Hospital Stay: Payer: 59

## 2018-10-29 ENCOUNTER — Encounter
Admission: RE | Disposition: A | Discharge status: Home Health | Payer: Self-pay | Source: Home / Self Care | Attending: Orthopedic Surgery

## 2018-10-29 ENCOUNTER — Other Ambulatory Visit: Payer: Self-pay

## 2018-10-29 ENCOUNTER — Inpatient Hospital Stay
Admission: RE | Admit: 2018-10-29 | Discharge: 2018-10-31 | DRG: 470 | Disposition: A | Discharge status: Home Health | Payer: 59 | Attending: Orthopedic Surgery | Admitting: Orthopedic Surgery

## 2018-10-29 DIAGNOSIS — Z79899 Other long term (current) drug therapy: Secondary | ICD-10-CM

## 2018-10-29 DIAGNOSIS — M1612 Unilateral primary osteoarthritis, left hip: Secondary | ICD-10-CM | POA: Diagnosis present

## 2018-10-29 DIAGNOSIS — G8918 Other acute postprocedural pain: Secondary | ICD-10-CM

## 2018-10-29 DIAGNOSIS — M1611 Unilateral primary osteoarthritis, right hip: Secondary | ICD-10-CM | POA: Diagnosis present

## 2018-10-29 DIAGNOSIS — G40909 Epilepsy, unspecified, not intractable, without status epilepticus: Secondary | ICD-10-CM | POA: Diagnosis present

## 2018-10-29 DIAGNOSIS — Z8249 Family history of ischemic heart disease and other diseases of the circulatory system: Secondary | ICD-10-CM

## 2018-10-29 DIAGNOSIS — Z419 Encounter for procedure for purposes other than remedying health state, unspecified: Secondary | ICD-10-CM

## 2018-10-29 DIAGNOSIS — Z96641 Presence of right artificial hip joint: Secondary | ICD-10-CM

## 2018-10-29 HISTORY — PX: TOTAL HIP ARTHROPLASTY: SHX124

## 2018-10-29 LAB — CBC
HCT: 35.4 % — ABNORMAL LOW (ref 36.0–46.0)
Hemoglobin: 11.8 g/dL — ABNORMAL LOW (ref 12.0–15.0)
MCH: 32 pg (ref 26.0–34.0)
MCHC: 33.3 g/dL (ref 30.0–36.0)
MCV: 95.9 fL (ref 80.0–100.0)
Platelets: 174 10*3/uL (ref 150–400)
RBC: 3.69 MIL/uL — ABNORMAL LOW (ref 3.87–5.11)
RDW: 12 % (ref 11.5–15.5)
WBC: 11.9 10*3/uL — ABNORMAL HIGH (ref 4.0–10.5)
nRBC: 0 % (ref 0.0–0.2)

## 2018-10-29 LAB — CREATININE, SERUM
Creatinine, Ser: 0.56 mg/dL (ref 0.44–1.00)
GFR calc Af Amer: >60 mL/min (ref >60)
GFR calc non Af Amer: >60 mL/min (ref >60)

## 2018-10-29 SURGERY — ARTHROPLASTY, HIP, TOTAL, ANTERIOR APPROACH
Anesthesia: Spinal | Site: Hip | Laterality: Right

## 2018-10-29 MED ORDER — LACTATED RINGERS IV SOLN
INTRAVENOUS | Status: DC
  Administered 2018-10-29: 12:00:00 via INTRAVENOUS

## 2018-10-29 MED ORDER — MIDAZOLAM HCL 5 MG/5ML IJ SOLN
INTRAMUSCULAR | Status: AC
  Filled 2018-10-29: qty 5

## 2018-10-29 MED ORDER — GLYCOPYRROLATE 0.2 MG/ML IJ SOLN
INTRAMUSCULAR | Status: DC | PRN
  Administered 2018-10-29: 0.2 mg via INTRAVENOUS

## 2018-10-29 MED ORDER — SODIUM CHLORIDE 0.9 % IV SOLN
INTRAVENOUS | Status: DC
  Administered 2018-10-29 – 2018-10-30 (×3): via INTRAVENOUS

## 2018-10-29 MED ORDER — FERROUS SULFATE 325 (65 FE) MG PO TABS
325.0000 mg | ORAL_TABLET | Freq: Every day | ORAL | Status: DC
  Administered 2018-10-29 – 2018-10-30 (×2): 325 mg via ORAL
  Filled 2018-10-29 (×2): qty 1

## 2018-10-29 MED ORDER — ONDANSETRON HCL 4 MG PO TABS: 4.0000 mg | ORAL_TABLET | Freq: Four times a day (QID) | ORAL | Status: DC | PRN

## 2018-10-29 MED ORDER — BACLOFEN 10 MG PO TABS
10.0000 mg | ORAL_TABLET | Freq: Two times a day (BID) | ORAL | Status: DC | PRN
  Filled 2018-10-29: qty 1

## 2018-10-29 MED ORDER — ONDANSETRON HCL 4 MG/2ML IJ SOLN
INTRAMUSCULAR | Status: DC | PRN
  Administered 2018-10-29: 4 mg via INTRAVENOUS

## 2018-10-29 MED ORDER — HYDROCODONE-ACETAMINOPHEN 7.5-325 MG PO TABS
1.0000 | ORAL_TABLET | ORAL | Status: DC | PRN
  Administered 2018-10-29 – 2018-10-31 (×2): 2 via ORAL
  Filled 2018-10-29 (×2): qty 2

## 2018-10-29 MED ORDER — PROPOFOL 500 MG/50ML IV EMUL
INTRAVENOUS | Status: DC | PRN
  Administered 2018-10-29: 50 ug/kg/min via INTRAVENOUS

## 2018-10-29 MED ORDER — DOCUSATE SODIUM 100 MG PO CAPS
100.0000 mg | ORAL_CAPSULE | Freq: Two times a day (BID) | ORAL | Status: DC
  Administered 2018-10-29 – 2018-10-31 (×4): 100 mg via ORAL
  Filled 2018-10-29 (×4): qty 1

## 2018-10-29 MED ORDER — FAMOTIDINE 20 MG PO TABS
ORAL_TABLET | ORAL | Status: AC
  Administered 2018-10-29: 20 mg via ORAL
  Filled 2018-10-29: qty 1

## 2018-10-29 MED ORDER — FENTANYL CITRATE (PF) 100 MCG/2ML IJ SOLN
INTRAMUSCULAR | Status: AC
  Filled 2018-10-29: qty 2

## 2018-10-29 MED ORDER — HYDROCODONE-ACETAMINOPHEN 5-325 MG PO TABS
1.0000 | ORAL_TABLET | ORAL | Status: DC | PRN
  Administered 2018-10-30 – 2018-10-31 (×5): 2 via ORAL
  Filled 2018-10-29 (×7): qty 2

## 2018-10-29 MED ORDER — BUPIVACAINE HCL (PF) 0.5 % IJ SOLN
INTRAMUSCULAR | Status: DC | PRN
  Administered 2018-10-29: 3 mL via INTRATHECAL

## 2018-10-29 MED ORDER — METOCLOPRAMIDE HCL 10 MG PO TABS: 5.0000 mg | ORAL_TABLET | Freq: Three times a day (TID) | ORAL | Status: DC | PRN

## 2018-10-29 MED ORDER — MORPHINE SULFATE (PF) 2 MG/ML IV SOLN
0.5000 mg | INTRAVENOUS | Status: DC | PRN
  Administered 2018-10-29: 1 mg via INTRAVENOUS
  Filled 2018-10-29: qty 1

## 2018-10-29 MED ORDER — CEFAZOLIN SODIUM-DEXTROSE 2-4 GM/100ML-% IV SOLN
2.0000 g | Freq: Four times a day (QID) | INTRAVENOUS | Status: AC
  Administered 2018-10-29 – 2018-10-30 (×2): 2 g via INTRAVENOUS
  Filled 2018-10-29 (×2): qty 100

## 2018-10-29 MED ORDER — ONDANSETRON HCL 4 MG/2ML IJ SOLN
INTRAMUSCULAR | Status: AC
  Filled 2018-10-29: qty 2

## 2018-10-29 MED ORDER — ALUM & MAG HYDROXIDE-SIMETH 200-200-20 MG/5ML PO SUSP: 30.0000 mL | ORAL | Status: DC | PRN

## 2018-10-29 MED ORDER — OXYCODONE HCL 5 MG/5ML PO SOLN: 5.0000 mg | Freq: Once | ORAL | Status: DC | PRN

## 2018-10-29 MED ORDER — ADULT MULTIVITAMIN W/MINERALS CH
1.0000 | ORAL_TABLET | Freq: Two times a day (BID) | ORAL | Status: DC
  Administered 2018-10-29 – 2018-10-31 (×4): 1 via ORAL
  Filled 2018-10-29 (×4): qty 1

## 2018-10-29 MED ORDER — ZOLPIDEM TARTRATE 5 MG PO TABS: 5.0000 mg | ORAL_TABLET | Freq: Every evening | ORAL | Status: DC | PRN

## 2018-10-29 MED ORDER — GLYCOPYRROLATE 0.2 MG/ML IJ SOLN
INTRAMUSCULAR | Status: AC
  Filled 2018-10-29: qty 1

## 2018-10-29 MED ORDER — ENOXAPARIN SODIUM 40 MG/0.4ML ~~LOC~~ SOLN
40.0000 mg | SUBCUTANEOUS | Status: DC
  Administered 2018-10-30 – 2018-10-31 (×2): 40 mg via SUBCUTANEOUS
  Filled 2018-10-29 (×2): qty 0.4

## 2018-10-29 MED ORDER — OXYCODONE HCL 5 MG PO TABS: 5.0000 mg | ORAL_TABLET | Freq: Once | ORAL | Status: DC | PRN

## 2018-10-29 MED ORDER — NEOMYCIN-POLYMYXIN B GU 40-200000 IR SOLN
Status: DC | PRN
  Administered 2018-10-29: 4 mL

## 2018-10-29 MED ORDER — MAGNESIUM HYDROXIDE 400 MG/5ML PO SUSP: 30.0000 mL | Freq: Every day | ORAL | Status: DC | PRN

## 2018-10-29 MED ORDER — FENTANYL CITRATE (PF) 100 MCG/2ML IJ SOLN
INTRAMUSCULAR | Status: DC | PRN
  Administered 2018-10-29 (×2): 50 ug via INTRAVENOUS

## 2018-10-29 MED ORDER — ONDANSETRON HCL 4 MG/2ML IJ SOLN
4.0000 mg | Freq: Four times a day (QID) | INTRAMUSCULAR | Status: DC | PRN
  Administered 2018-10-29: 4 mg via INTRAVENOUS
  Filled 2018-10-29: qty 2

## 2018-10-29 MED ORDER — METHOCARBAMOL 500 MG PO TABS
500.0000 mg | ORAL_TABLET | Freq: Four times a day (QID) | ORAL | Status: DC | PRN
  Administered 2018-10-30 – 2018-10-31 (×2): 500 mg via ORAL
  Filled 2018-10-29 (×2): qty 1

## 2018-10-29 MED ORDER — SODIUM CHLORIDE 0.9 % IV SOLN
INTRAVENOUS | Status: DC | PRN
  Administered 2018-10-29: 60 mL

## 2018-10-29 MED ORDER — METHOCARBAMOL 1000 MG/10ML IJ SOLN
500.0000 mg | Freq: Four times a day (QID) | INTRAVENOUS | Status: DC | PRN
  Filled 2018-10-29: qty 5

## 2018-10-29 MED ORDER — BUPIVACAINE-EPINEPHRINE (PF) 0.25% -1:200000 IJ SOLN
INTRAMUSCULAR | Status: AC
  Filled 2018-10-29: qty 30

## 2018-10-29 MED ORDER — FOLIC ACID 1 MG PO TABS
500.0000 ug | ORAL_TABLET | Freq: Every day | ORAL | Status: DC
  Administered 2018-10-30 – 2018-10-31 (×2): 0.5 mg via ORAL
  Filled 2018-10-29 (×2): qty 1

## 2018-10-29 MED ORDER — MAGNESIUM CITRATE PO SOLN
1.0000 | Freq: Once | ORAL | Status: DC | PRN
  Filled 2018-10-29: qty 296

## 2018-10-29 MED ORDER — MIDAZOLAM HCL 5 MG/5ML IJ SOLN
INTRAMUSCULAR | Status: DC | PRN
  Administered 2018-10-29: 3 mg via INTRAVENOUS
  Administered 2018-10-29: 2 mg via INTRAVENOUS

## 2018-10-29 MED ORDER — TRAMADOL HCL 50 MG PO TABS
50.0000 mg | ORAL_TABLET | Freq: Four times a day (QID) | ORAL | Status: DC
  Administered 2018-10-29 – 2018-10-30 (×3): 50 mg via ORAL
  Filled 2018-10-29 (×3): qty 1

## 2018-10-29 MED ORDER — LEVETIRACETAM 750 MG PO TABS
750.0000 mg | ORAL_TABLET | Freq: Two times a day (BID) | ORAL | Status: DC
  Administered 2018-10-29 – 2018-10-31 (×4): 750 mg via ORAL
  Filled 2018-10-29 (×5): qty 1

## 2018-10-29 MED ORDER — BUPIVACAINE HCL (PF) 0.5 % IJ SOLN
INTRAMUSCULAR | Status: AC
  Filled 2018-10-29: qty 10

## 2018-10-29 MED ORDER — METOCLOPRAMIDE HCL 5 MG/ML IJ SOLN: 5.0000 mg | Freq: Three times a day (TID) | INTRAMUSCULAR | Status: DC | PRN

## 2018-10-29 MED ORDER — FENTANYL CITRATE (PF) 100 MCG/2ML IJ SOLN: 25.0000 ug | INTRAMUSCULAR | Status: DC | PRN

## 2018-10-29 MED ORDER — SODIUM CHLORIDE (PF) 0.9 % IJ SOLN
INTRAMUSCULAR | Status: AC
  Filled 2018-10-29: qty 50

## 2018-10-29 MED ORDER — ACETAMINOPHEN 325 MG PO TABS: 325.0000 mg | ORAL_TABLET | Freq: Four times a day (QID) | ORAL | Status: DC | PRN

## 2018-10-29 MED ORDER — MENTHOL 3 MG MT LOZG
1.0000 | LOZENGE | OROMUCOSAL | Status: DC | PRN
  Filled 2018-10-29: qty 9

## 2018-10-29 MED ORDER — DIPHENHYDRAMINE HCL 12.5 MG/5ML PO ELIX: 12.5000 mg | ORAL_SOLUTION | ORAL | Status: DC | PRN

## 2018-10-29 MED ORDER — MAGNESIUM OXIDE 400 (241.3 MG) MG PO TABS
400.0000 mg | ORAL_TABLET | Freq: Two times a day (BID) | ORAL | Status: DC
  Administered 2018-10-29 – 2018-10-31 (×4): 400 mg via ORAL
  Filled 2018-10-29 (×4): qty 1

## 2018-10-29 MED ORDER — PHENOL 1.4 % MT LIQD
1.0000 | OROMUCOSAL | Status: DC | PRN
  Filled 2018-10-29: qty 177

## 2018-10-29 MED ORDER — PHENYLEPHRINE HCL (PRESSORS) 10 MG/ML IV SOLN
INTRAVENOUS | Status: DC | PRN
  Administered 2018-10-29: 100 ug via INTRAVENOUS

## 2018-10-29 MED ORDER — GABAPENTIN 300 MG PO CAPS
300.0000 mg | ORAL_CAPSULE | Freq: Three times a day (TID) | ORAL | Status: DC
  Administered 2018-10-29 – 2018-10-31 (×6): 300 mg via ORAL
  Filled 2018-10-29 (×6): qty 1

## 2018-10-29 MED ORDER — BUPIVACAINE-EPINEPHRINE 0.25% -1:200000 IJ SOLN
INTRAMUSCULAR | Status: DC | PRN
  Administered 2018-10-29: 30 mL

## 2018-10-29 MED ORDER — VITAMIN D 25 MCG (1000 UNIT) PO TABS
2000.0000 [IU] | ORAL_TABLET | Freq: Every day | ORAL | Status: DC
  Administered 2018-10-30 – 2018-10-31 (×2): 2000 [IU] via ORAL
  Filled 2018-10-29 (×3): qty 2

## 2018-10-29 MED ORDER — FAMOTIDINE 20 MG PO TABS
20.0000 mg | ORAL_TABLET | Freq: Once | ORAL | Status: AC
  Administered 2018-10-29: 12:00:00 20 mg via ORAL

## 2018-10-29 MED ORDER — PHENYTOIN SODIUM EXTENDED 100 MG PO CAPS
200.0000 mg | ORAL_CAPSULE | Freq: Two times a day (BID) | ORAL | Status: DC
  Administered 2018-10-29 – 2018-10-31 (×4): 200 mg via ORAL
  Filled 2018-10-29 (×5): qty 2

## 2018-10-29 MED ORDER — CEFAZOLIN SODIUM-DEXTROSE 2-4 GM/100ML-% IV SOLN
INTRAVENOUS | Status: AC
  Filled 2018-10-29: qty 100

## 2018-10-29 MED ORDER — BISACODYL 5 MG PO TBEC: 5.0000 mg | DELAYED_RELEASE_TABLET | Freq: Every day | ORAL | Status: DC | PRN

## 2018-10-29 SURGICAL SUPPLY — 59 items
BLADE SAGITTAL AGGR TOOTH XLG (BLADE) ×3 IMPLANT
BNDG COHESIVE 6X5 TAN STRL LF (GAUZE/BANDAGES/DRESSINGS) ×9 IMPLANT
CANISTER SUCT 1200ML W/VALVE (MISCELLANEOUS) ×3 IMPLANT
CANISTER WOUND CARE 500ML ATS (WOUND CARE) ×3 IMPLANT
CHLORAPREP W/TINT 26 (MISCELLANEOUS) ×3 IMPLANT
COVER BACK TABLE REUSABLE LG (DRAPES) ×3 IMPLANT
COVER WAND RF STERILE (DRAPES) ×3 IMPLANT
DRAPE 3/4 80X56 (DRAPES) ×9 IMPLANT
DRAPE C-ARM XRAY 36X54 (DRAPES) ×3 IMPLANT
DRAPE INCISE IOBAN 66X60 STRL (DRAPES) IMPLANT
DRAPE POUCH INSTRU U-SHP 10X18 (DRAPES) ×3 IMPLANT
DRESSING SURGICEL FIBRLLR 1X2 (HEMOSTASIS) ×2 IMPLANT
DRSG OPSITE POSTOP 4X8 (GAUZE/BANDAGES/DRESSINGS) ×6 IMPLANT
DRSG SURGICEL FIBRILLAR 1X2 (HEMOSTASIS) ×6
ELECT BLADE 6.5 EXT (BLADE) ×3 IMPLANT
ELECT REM PT RETURN 9FT ADLT (ELECTROSURGICAL) ×3
ELECTRODE REM PT RTRN 9FT ADLT (ELECTROSURGICAL) ×1 IMPLANT
GLOVE BIOGEL PI IND STRL 9 (GLOVE) ×1 IMPLANT
GLOVE BIOGEL PI INDICATOR 9 (GLOVE) ×2
GLOVE SURG SYN 9.0  PF PI (GLOVE) ×4
GLOVE SURG SYN 9.0 PF PI (GLOVE) ×2 IMPLANT
GOWN SRG 2XL LVL 4 RGLN SLV (GOWNS) ×1 IMPLANT
GOWN STRL NON-REIN 2XL LVL4 (GOWNS) ×2
GOWN STRL REUS W/ TWL LRG LVL3 (GOWN DISPOSABLE) ×1 IMPLANT
GOWN STRL REUS W/TWL LRG LVL3 (GOWN DISPOSABLE) ×2
HEAD FEMORAL 28MM SZ S (Head) ×3 IMPLANT
HEMOVAC 400CC 10FR (MISCELLANEOUS) IMPLANT
HIP DBL LINER 54X28 (Liner) ×3 IMPLANT
HOLDER FOLEY CATH W/STRAP (MISCELLANEOUS) ×3 IMPLANT
HOOD PEEL AWAY FLYTE STAYCOOL (MISCELLANEOUS) ×3 IMPLANT
KIT PREVENA INCISION MGT 13 (CANNISTER) ×3 IMPLANT
MAT ABSORB  FLUID 56X50 GRAY (MISCELLANEOUS) ×2
MAT ABSORB FLUID 56X50 GRAY (MISCELLANEOUS) ×1 IMPLANT
NDL SAFETY ECLIPSE 18X1.5 (NEEDLE) ×1 IMPLANT
NEEDLE HYPO 18GX1.5 SHARP (NEEDLE) ×2
NEEDLE SPNL 20GX3.5 QUINCKE YW (NEEDLE) ×6 IMPLANT
NS IRRIG 1000ML POUR BTL (IV SOLUTION) ×3 IMPLANT
PACK HIP COMPR (MISCELLANEOUS) ×3 IMPLANT
SCALPEL PROTECTED #10 DISP (BLADE) ×6 IMPLANT
SHELL ACETABULAR SZ 54 DM (Shell) ×3 IMPLANT
SOL PREP PVP 2OZ (MISCELLANEOUS) ×3
SOLUTION PREP PVP 2OZ (MISCELLANEOUS) ×1 IMPLANT
SPONGE DRAIN TRACH 4X4 STRL 2S (GAUZE/BANDAGES/DRESSINGS) ×3 IMPLANT
STAPLER SKIN PROX 35W (STAPLE) ×3 IMPLANT
STEM FEMORAL SZ3  STD COLLARED (Stem) ×3 IMPLANT
STRAP SAFETY 5IN WIDE (MISCELLANEOUS) ×3 IMPLANT
SUT DVC 2 QUILL PDO  T11 36X36 (SUTURE) ×2
SUT DVC 2 QUILL PDO T11 36X36 (SUTURE) ×1 IMPLANT
SUT SILK 0 (SUTURE) ×2
SUT SILK 0 30XBRD TIE 6 (SUTURE) ×1 IMPLANT
SUT V-LOC 90 ABS DVC 3-0 CL (SUTURE) ×3 IMPLANT
SUT VIC AB 1 CT1 36 (SUTURE) ×3 IMPLANT
SYR 20ML LL LF (SYRINGE) ×3 IMPLANT
SYR 30ML LL (SYRINGE) ×3 IMPLANT
SYR 50ML LL SCALE MARK (SYRINGE) ×6 IMPLANT
SYR BULB IRRIG 60ML STRL (SYRINGE) ×3 IMPLANT
TAPE MICROFOAM 4IN (TAPE) ×3 IMPLANT
TOWEL OR 17X26 4PK STRL BLUE (TOWEL DISPOSABLE) ×3 IMPLANT
TRAY FOLEY MTR SLVR 16FR STAT (SET/KITS/TRAYS/PACK) ×3 IMPLANT
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
## 2018-10-29 NOTE — Transfer of Care (Signed)
Immediate Anesthesia Transfer of Care Note  Patient: Tara Carlson  Procedure(s) Performed: TOTAL HIP ARTHROPLASTY ANTERIOR APPROACH (Right Hip)  Patient Location: PACU  Anesthesia Type:Spinal  Level of Consciousness: awake, alert  and oriented  Airway & Oxygen Therapy: Patient Spontanous Breathing  Post-op Assessment: Report given to RN and Post -op Vital signs reviewed and stable  Post vital signs: Reviewed and stable  Last Vitals:  Vitals Value Taken Time  BP    Temp    Pulse    Resp    SpO2      Last Pain:  Vitals:   10/29/18 1109  TempSrc: Oral  PainSc: 8          Complications: No apparent anesthesia complications

## 2018-10-29 NOTE — H&P (Signed)
Reviewed paper H+P, will be scanned into chart. No changes noted.  

## 2018-10-29 NOTE — Op Note (Signed)
10/29/2018  3:10 PM  PATIENT:  Tara Carlson  43 y.o. female  PRE-OPERATIVE DIAGNOSIS:  PRIMARY OSTEOARTHRITIS OF RIGHT HIP  POST-OPERATIVE DIAGNOSIS:  PRIMARY OSTEOARTHRITIS OF RIGHT HIP  PROCEDURE:  Procedure(s): TOTAL HIP ARTHROPLASTY ANTERIOR APPROACH (Right)  SURGEON: Laurene Footman, MD  ASSISTANTS: none  ANESTHESIA:   spinal  EBL:  Total I/O In: 1400 [I.V.:1400] Out: 800 [Urine:300; Blood:500]  BLOOD ADMINISTERED:none  DRAINS: none   LOCAL MEDICATIONS USED:  OTHER exparel  SPECIMEN:  Source of Specimen:  Femoral head right  DISPOSITION OF SPECIMEN:  PATHOLOGY  COUNTS:  YES  TOURNIQUET:  * No tourniquets in log *  IMPLANTS: Medacta AMIS 3 std, ceramic 28 mm S head with 54 mm Mpact DM cup and liner  DICTATION: .Dragon Dictation   The patient was brought to the operating room and after spinal anesthesia was obtained patient was placed on the operative table with the ipsilateral foot into the Medacta attachment, contralateral leg on a well-padded table. C-arm was brought in and preop template x-ray taken. After prepping and draping in usual sterile fashion appropriate patient identification and timeout procedures were completed. Anterior approach to the hip was obtained and centered over the greater trochanter and TFL muscle. The subcutaneous tissue was incised hemostasis being achieved by electrocautery. TFL fascia was incised and the muscle retracted laterally deep retractor placed. The lateral femoral circumflex vessels were identified and ligated. The anterior capsule was exposed and a capsulotomy performed. The neck was identified and a femoral neck cut carried out with a saw. The head was removed without difficulty and showed sclerotic femoral head and acetabulum. Reaming was carried out to 54 mm and a 54 mm cup trial gave appropriate tightness to the acetabular component a 54 DM cup was impacted into position. The leg was then externally rotated and  ischiofemoral and pubofemoral releases carried out. The femur was sequentially broached to a size 3, size 3 standard with as had trials were placed and the final components chosen. The 3 standard stem was inserted along with a ceramic S 28 mm head and 54 mm liner. The hip was reduced and was stable the wound was thoroughly irrigated with fibrillar placed along the posterior capsule and medial neck. The deep fascia ws closed using a heavy Quill, with Exparel being injected throughout the case for postop analgesia e.3-0 V-loc to close the skin with skin staples.  Incisional wound VAC applied and patient was sent to recovery in stable condition.   PLAN OF CARE: Admit to inpatient

## 2018-10-29 NOTE — Anesthesia Procedure Notes (Signed)
Spinal  Patient location during procedure: OR Staffing Anesthesiologist: Fitzgerald, Kathryn L, MD Resident/CRNA: Evangelos Paulino, CRNA Performed: resident/CRNA  Preanesthetic Checklist Completed: patient identified, site marked, surgical consent, pre-op evaluation, timeout performed, IV checked, risks and benefits discussed and monitors and equipment checked Spinal Block Patient position: sitting Prep: ChloraPrep and site prepped and draped Patient monitoring: heart rate, continuous pulse ox, blood pressure and cardiac monitor Approach: midline Location: L4-5 Injection technique: single-shot Needle Needle type: Introducer and Pencan  Needle gauge: 24 G Needle length: 9 cm Additional Notes Negative paresthesia. Negative blood return. Positive free-flowing CSF. Expiration date of kit checked and confirmed. Patient tolerated procedure well, without complications.       

## 2018-10-29 NOTE — Anesthesia Post-op Follow-up Note (Signed)
Anesthesia QCDR form completed.        

## 2018-10-30 LAB — BASIC METABOLIC PANEL
Anion gap: 7 (ref 5–15)
BUN: 8 mg/dL (ref 6–20)
CO2: 25 mmol/L (ref 22–32)
Calcium: 8.1 mg/dL — ABNORMAL LOW (ref 8.9–10.3)
Chloride: 99 mmol/L (ref 98–111)
Creatinine, Ser: 0.5 mg/dL (ref 0.44–1.00)
GFR calc Af Amer: >60 mL/min (ref >60)
GFR calc non Af Amer: >60 mL/min (ref >60)
Glucose, Bld: 118 mg/dL — ABNORMAL HIGH (ref 70–99)
Potassium: 3.9 mmol/L (ref 3.5–5.1)
Sodium: 131 mmol/L — ABNORMAL LOW (ref 135–145)

## 2018-10-30 LAB — CBC
HCT: 32.8 % — ABNORMAL LOW (ref 36.0–46.0)
Hemoglobin: 11.1 g/dL — ABNORMAL LOW (ref 12.0–15.0)
MCH: 31.6 pg (ref 26.0–34.0)
MCHC: 33.8 g/dL (ref 30.0–36.0)
MCV: 93.4 fL (ref 80.0–100.0)
Platelets: 160 10*3/uL (ref 150–400)
RBC: 3.51 MIL/uL — ABNORMAL LOW (ref 3.87–5.11)
RDW: 11.9 % (ref 11.5–15.5)
WBC: 9.2 10*3/uL (ref 4.0–10.5)
nRBC: 0 % (ref 0.0–0.2)

## 2018-10-30 MED ORDER — SODIUM CHLORIDE 0.9 % IV BOLUS
500.0000 mL | Freq: Once | INTRAVENOUS | Status: AC
  Administered 2018-10-30: 500 mL via INTRAVENOUS

## 2018-10-30 NOTE — Evaluation (Signed)
Physical Therapy Evaluation Patient Details Name: Tara Carlson MRN: [id number] DOB: 07-Oct-1975 Today's Date: 10/30/2018   History of Present Illness  Ms. SHARISSA BRIERLEY is a 43 year old female who is s/p R THA anterior approach.  Clinical Impression  Pt admitted with above diagnosis. Pt currently with functional limitations due to the deficits listed below (see "PT Problem List"). Upon entry, pt in bed, awake and agreeable to participate. The pt is alert and oriented x4, pleasant, conversational, and generally a good historian. Pt reports high level pain in low back and right groin. ModA to EOB, ModA to stand, MinGuard for pivot to chair, at which point pt is presyncopal and hypotensive. Functional mobility assessment demonstrates increased effort/time requirements, poor tolerance, and need for physical assistance, whereas the patient performed these at a higher level of independence PTA. Pt educated on HEP in bed, but requires maxA for limb movement with moderate hip range limitation from pain. Pt will benefit from skilled PT intervention to increase independence and safety with basic mobility in preparation for discharge to the venue listed below.       Follow Up Recommendations Home health PT;Follow surgeon's recommendation for DC plan and follow-up therapies;Supervision for mobility/OOB    Equipment Recommendations  Rolling walker with 5" wheels    Recommendations for Other Services       Precautions / Restrictions Precautions Precautions: Fall Restrictions Weight Bearing Restrictions: Yes RLE Weight Bearing: Weight bearing as tolerated      Mobility  Bed Mobility Overal bed mobility: Needs Assistance Bed Mobility: Supine to Sit     Supine to sit: Mod assist     General bed mobility comments: author provides hand grip for LUE pull up to EOB, author assist RLE to floor OOB  Transfers Overall transfer level: Needs assistance Equipment used: Rolling walker (2  wheeled) Transfers: Sit to/from [facility] Sit to Stand: Min assist;From elevated surface Stand pivot transfers: Min guard       General transfer comment: pt becomes presyncopal and hypotensive, reclined in chair, RN called to room.  Ambulation/Gait                Stairs            Wheelchair Mobility    Modified Rankin (Stroke Patients Only)       Balance Overall balance assessment: Needs assistance;Modified Independent Sitting-balance support: Single extremity supported;Feet supported Sitting balance-Leahy Scale: Good                                       Pertinent Vitals/Pain Pain Assessment: 0-10 Pain Score: 10-Worst pain ever Pain Location: Pt reporting 10/10 pain in her Rt groin and lower back. Pain Descriptors / Indicators: Sore;Constant;Grimacing;Burning Pain Intervention(s): Limited activity within patient's tolerance;Monitored during session;RN gave pain meds during session    [place] expects to be discharged to:: Private residence Living Arrangements: Spouse/significant other Available Help at Discharge: Family;Available 24 hours/day Type of Home: House Home Access: Stairs to enter Entrance Stairs-Rails: Right Entrance Stairs-Number of Steps: 2-3 Home Layout: One level Home Equipment: Kasandra Knudsen - quad;Walker - 2 wheels;Shower seat - built in;Hand held shower head      Prior Function Level of Independence: Independent         Comments: Pt states she was totally independent in all ADL and mobility prior to sx. Drving, community ambulator. 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     Hand Dominance        Extremity/Trunk Assessment   Upper Extremity Assessment Upper Extremity Assessment: Overall WFL for tasks assessed    Lower Extremity Assessment Lower Extremity Assessment: Overall WFL for tasks assessed;RLE deficits/detail RLE: Unable to fully assess due  to pain RLE Coordination: decreased gross motor    Cervical / Trunk Assessment Cervical / Trunk Assessment: Normal  Communication   Communication: No difficulties  Cognition Arousal/Alertness: Lethargic Behavior During Therapy: WFL for tasks assessed/performed Overall Cognitive Status: Within Functional Limits for tasks assessed                                 General Comments: Pt at times closing eyes druing evaluation. Able to be re-directed and attended to education with prompting.      General Comments      Exercises Total Joint Exercises Ankle Circles/Pumps: AROM;Both;15 reps;Supine Short Arc Quad: AROM;Both;15 reps;Supine Heel Slides: AAROM;Both;15 reps;Supine;AROM Hip ABduction/ADduction: AROM;Right;15 reps;Supine Other Exercises Other Exercises: Pt educated in safe use of AE for LB adl mgt, home routines modifications, importance of early mobility after hip surgery, & compression stocking mgt. Handout provided.   Assessment/Plan    PT Assessment Patient needs continued PT services  PT Problem List Decreased strength;Decreased activity tolerance;Decreased range of motion;Decreased balance;Decreased mobility;Decreased coordination;Decreased knowledge of precautions       PT Treatment Interventions DME instruction;Gait training;Stair training;Functional mobility training;Therapeutic activities;Therapeutic exercise;Patient/family education;Balance training    PT Goals (Current goals can be found in the Care Plan section)  Acute Rehab PT Goals Patient Stated Goal: To have less pain PT Goal Formulation: With patient Time For Goal Achievement: 11/13/18 Potential to Achieve Goals: Good    Frequency BID   Barriers to discharge        Co-evaluation               AM-PAC PT "6 Clicks" Mobility  Outcome Measure Help needed turning from your back to your side while in a flat bed without using bedrails?: A Lot Help needed moving from lying on your  back to sitting on the side of a flat bed without using bedrails?: A Lot Help needed moving to and from a bed to a chair (including a wheelchair)?: A Little Help needed standing up from a chair using your arms (e.g., wheelchair or bedside chair)?: A Lot Help needed to walk in hospital room?: Total Help needed climbing 3-5 steps with a railing? : Total 6 Click Score: 11    End of Session   Activity Tolerance: Treatment limited secondary to medical complications (Comment);Patient limited by pain Patient left: in chair;with family/visitor present;with call bell/phone within reach;with nursing/sitter in room;with SCD's reapplied Nurse Communication: Mobility status;Precautions PT Visit Diagnosis: Unsteadiness on feet (R26.81);Other abnormalities of gait and mobility (R26.89);Muscle weakness (generalized) (M62.81);Difficulty in walking, not elsewhere classified (R26.2)    Time: [id number] PT Time Calculation (min) (ACUTE ONLY): 37 min   Charges:   PT Evaluation $PT Eval Moderate Complexity: 1 Mod PT Treatments $Therapeutic Exercise: 8-22 mins        12:33 PM, 10/30/18 Rosamaria LintsAllan C Finnbar Cedillos, PT, DPT Physical Therapist - [facility]  [phone] (ASCOM)    Jayani Rozman C 10/30/2018, 12:30 PM

## 2018-10-30 NOTE — Progress Notes (Signed)
Pt presyncopal in PT session after stand-pivot transfer to chair. Pt asked for water to drink, then reporting "I think I'm going to pass out," also noted nausea, sudden decline in alertness with more lethargy, some intermittent eye rolling, slowed speech. BP assessed immediately, noted to be hypotensive, then assessed at 60, 120, and 180 seconds, these latter 3 taken with patient fully reclined, legs elevated. Pt never has total LOC, but remains resting eyes closes, appears on the verge of sleep, still responding to questioning. RN made aware.   BP: 90/49 seated at 0sec, 95/51 mmHg at 60sec, 94/58 mmHg at 120sec, and 98/59mmHg at 180sec.   Pt left in care of nursing in room. SCD donned, pt made comfortable.   10:03 AM, 10/30/18 Etta Grandchild, PT, DPT Physical Therapist - [facility]  [phone] [facility])

## 2018-10-30 NOTE — Anesthesia Postprocedure Evaluation (Signed)
Anesthesia Post Note  Patient: Tara Carlson  Procedure(s) Performed: TOTAL HIP ARTHROPLASTY ANTERIOR APPROACH (Right Hip)  Patient location during evaluation: Nursing Unit Anesthesia Type: Spinal Level of consciousness: oriented and awake and alert Pain management: pain level controlled Vital Signs Assessment: post-procedure vital signs reviewed and stable Respiratory status: spontaneous breathing and respiratory function stable Cardiovascular status: blood pressure returned to baseline and stable Postop Assessment: no headache, no backache, no apparent nausea or vomiting and patient able to bend at knees Anesthetic complications: no     Last Vitals:  Vitals:   10/30/18 0643 10/30/18 0755  BP: 118/69 111/63  Pulse: 80 82  Resp: 13 15  Temp:  37.1 C  SpO2: 100% 100%    Last Pain:  Vitals:   10/30/18 0755  TempSrc: Oral  PainSc:                  Jerrye Noble
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
## 2018-10-30 NOTE — TOC Benefit Eligibility Note (Signed)
Transition of Care [facility]) Benefit Eligibility Note    Patient Details  Name: Tara Carlson MRN: [id number] Date of Birth: 14-Sep-1975   Medication/Dose: Enoxaparin 65m once daily for 14 days  Covered?: Yes  Prescription Coverage [facility]/Company/Phone Number:: Joe with [employer] at [phone] Co-Pay: $35.00 estimated copay  Prior Approval: No  [place] [phone]/03/2018, 9:21 AM

## 2018-10-30 NOTE — Evaluation (Signed)
Occupational Therapy Evaluation Patient Details Name: Tara Carlson MRN: [id number] DOB: 05-22-1975 Today's Date: 10/30/2018    History of Present Illness Tara Carlson is a 43 year old female who is s/p R THA anterior approach.   Clinical Impression   Tara Carlson was seen for OT evaluation this date, POD#1 from above surgery. Pt reports that she was independent in all ADLs and functional mobility prior to surgery. Pt is eager to return to PLOF with less pain and improved safety and independence. Pt currently requires moderate assist for LB dressing and bathing while in seated position due to pain and limited AROM of R hip. Pt instructed in self care skills, falls prevention strategies, home/routines modifications, DME/AE for LB bathing and dressing tasks, and compression stocking mgt strategies. Pt would benefit from additional instruction in self care skills and techniques to help maintain precautions with or without assistive devices to support recall and carryover prior to discharge. Recommend HHOT upon discharge.       Follow Up Recommendations  Home health OT;Supervision - Intermittent    Equipment Recommendations  3 in 1 bedside commode    Recommendations for Other Services       Precautions / Restrictions Precautions Precautions: Fall Restrictions Weight Bearing Restrictions: Yes RLE Weight Bearing: Weight bearing as tolerated      Mobility Bed Mobility               General bed mobility comments: Mobility deferred 2/2 pt being in 10/10 pain. RN in room to administer pain medication at end of session.  Transfers                      Balance                                           ADL either performed or assessed with clinical judgement   ADL                                         General ADL Comments: Pt limited by pain at time of OT evaluation declined offer to participate in ADL tasks. Able to  sel-feed independently from breakfast tray. No issues with UE use appreciated with assessment. Pt likley moderate assist for LB ADL mgt in seated position due to increased pain and limited ROM of R hip. Would benefit from trial of AE to support functional independence.     Vision Baseline Vision/History: Wears glasses Wears Glasses: At all times Patient Visual Report: No change from baseline       Perception     Praxis      Pertinent Vitals/Pain Pain Assessment: 0-10 Pain Score: 10-Worst pain ever Pain Location: Pt reporting 10/10 pain in her RLE and lower back. Pain Descriptors / Indicators: Sore;Constant;Grimacing Pain Intervention(s): Limited activity within patient's tolerance;Monitored during session;RN gave pain meds during session     Hand Dominance     Extremity/Trunk Assessment Upper Extremity Assessment Upper Extremity Assessment: Overall WFL for tasks assessed   Lower Extremity Assessment Lower Extremity Assessment: Defer to PT evaluation       Communication Communication Communication: No difficulties   Cognition Arousal/Alertness: Lethargic;Suspect due to medications Behavior During Therapy: [facility] for tasks assessed/performed Overall Cognitive Status: Within Functional Limits for  tasks assessed                                 General Comments: Pt at times closing eyes druing evaluation. Able to be re-directed and attended to education with prompting.   General Comments       Exercises Total Joint Exercises Ankle Circles/Pumps: AROM;Both;15 reps;Supine Short Arc Quad: AROM;Both;15 reps;Supine Heel Slides: AAROM;Both;15 reps;Supine;AROM Hip ABduction/ADduction: AROM;Right;15 reps;Supine Other Exercises Other Exercises: Pt educated in safe use of AE for LB adl mgt, home routines modifications, importance of early mobility after hip surgery, & compression stocking mgt. Handout provided.   Shoulder Instructions      Home Living  Family/patient expects to be discharged to:: Private residence Living Arrangements: Spouse/significant other Available Help at Discharge: Family;Available 24 hours/day Type of Home: House Home Access: Stairs to enter [employer] of Steps: 2-3 Entrance Stairs-Rails: Right Home Layout: One level     Bathroom Shower/Tub: Tub/shower unit;Walk-in shower(Uses walk-in primarily)   Bathroom Toilet: Standard     Home Equipment: Krystal Clark - 2 wheels;Shower seat - built in;Hand held shower head          Prior Functioning/Environment Level of Independence: Independent        Comments: Pt states she was totally independent in all ADL and mobility prior to sx. Drving, community ambulator. 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        OT Problem List: Decreased strength;Decreased coordination;Pain;Decreased range of motion;Decreased activity tolerance;Decreased safety awareness;Impaired balance (sitting and/or standing);Decreased knowledge of use of DME or AE;Decreased knowledge of precautions      OT Treatment/Interventions: Self-care/ADL training;Balance training;Therapeutic exercise;Therapeutic activities;Energy conservation;Patient/family education;DME and/or AE instruction    OT Goals(Current goals can be found in the care plan section) Acute Rehab OT Goals Patient Stated Goal: To have less pain OT Goal Formulation: With patient Time For Goal Achievement: 11/13/18 Potential to Achieve Goals: Good ADL Goals Pt Will Perform Lower Body Bathing: with adaptive equipment;sitting/lateral leans;with modified independence(With LRAD PRN for improved safety and functional independence) Pt Will Perform Lower Body Dressing: with adaptive equipment;with modified independence;sit to/from stand(With LRAD PRN for improved safety and functional independence) Pt Will Transfer to Toilet: ambulating;bedside commode(With LRAD PRN for improved safety and  functional independence) Pt Will Perform Toileting - Clothing Manipulation and hygiene: sit to/from stand;with adaptive equipment;with modified independence(With LRAD PRN for improved safety and functional independence)  OT Frequency: Min 1X/week   Barriers to D/C: Inaccessible home environment  Pt with steps to enter the home.       Co-evaluation              AM-PAC OT "6 Clicks" Daily Activity     Outcome Measure Help from another person eating meals?: None Help from another person taking care of personal grooming?: A Little Help from another person toileting, which includes using toliet, bedpan, or urinal?: A Lot Help from another person bathing (including washing, rinsing, drying)?: A Lot Help from another person to put on and taking off regular upper body clothing?: A Little Help from another person to put on and taking off regular lower body clothing?: A Lot 6 Click Score: 16   End of Session    Activity Tolerance: Patient limited by pain Patient left: in bed;with call bell/phone within reach;with bed alarm set;with nursing/sitter in room;with SCD's reapplied  OT Visit Diagnosis: Other abnormalities of gait and mobility (R26.89);Pain Pain -  Right/Left: Right Pain - part of body: Hip(back)                Time: 0721-8288 OT Time Calculation (min): 23 min Charges:  OT General Charges $OT Visit: 1 Visit OT Evaluation $OT Eval Low Complexity: 1 Low OT Treatments $Self Care/Home Management : 8-22 mins  Shara Blazing, M.S., OTR/L Ascom: [phone] 10/30/18, 11:25 AM

## 2018-10-30 NOTE — TOC Progression Note (Signed)
Transition of Care [facility]) - Progression Note    Patient Details  Name: Tara Carlson MRN: [id number] Date of Birth: 1976/01/12  Transition of Care [facility]) CM/SW Contact  Aliviah Spain, Lenice Llamas Phone Number: [phone]  10/30/2018, 8:48 AM  Clinical Narrative: Lovenox price requested.           Expected Discharge Plan and Services                                                 Social Determinants of Health (SDOH) Interventions    Readmission Risk Interventions No flowsheet data found.
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
## 2018-10-31 LAB — BASIC METABOLIC PANEL
Anion gap: 7 (ref 5–15)
BUN: 7 mg/dL (ref 6–20)
CO2: 25 mmol/L (ref 22–32)
Calcium: 8 mg/dL — ABNORMAL LOW (ref 8.9–10.3)
Chloride: 108 mmol/L (ref 98–111)
Creatinine, Ser: 0.35 mg/dL — ABNORMAL LOW (ref 0.44–1.00)
GFR calc Af Amer: >60 mL/min (ref >60)
GFR calc non Af Amer: >60 mL/min (ref >60)
Glucose, Bld: 85 mg/dL (ref 70–99)
Potassium: 3.8 mmol/L (ref 3.5–5.1)
Sodium: 140 mmol/L (ref 135–145)

## 2018-10-31 LAB — SURGICAL PATHOLOGY

## 2018-10-31 MED ORDER — SODIUM CHLORIDE 0.9 % IV BOLUS: 250.0000 mL | Freq: Once | INTRAVENOUS | Status: DC

## 2018-10-31 MED ORDER — BISACODYL 5 MG PO TBEC: 5.0000 mg | DELAYED_RELEASE_TABLET | Freq: Every day | ORAL | 0 refills | Status: DC | PRN

## 2018-10-31 MED ORDER — METHOCARBAMOL 500 MG PO TABS: 500.0000 mg | ORAL_TABLET | Freq: Four times a day (QID) | ORAL | 0 refills | Status: DC | PRN

## 2018-10-31 MED ORDER — ENOXAPARIN SODIUM 40 MG/0.4ML ~~LOC~~ SOLN: 40.0000 mg | SUBCUTANEOUS | 0 refills | Status: DC

## 2018-10-31 MED ORDER — HYDROCODONE-ACETAMINOPHEN 5-325 MG PO TABS: 1.0000 | ORAL_TABLET | ORAL | 0 refills | Status: DC | PRN
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
## 2018-10-31 NOTE — Progress Notes (Signed)
Discharge instructions reviewed with patient and her husband. They verbalized understanding. IV removed. Woundvac changed out and given honeycomb dressing. Patient wheeled out to husband.
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
## 2018-10-31 NOTE — TOC Transition Note (Signed)
Transition of Care [facility]) - CM/SW Discharge Note   Patient Details  Name: HANNAN TETZLAFF MRN: [id number] Date of Birth: 08-08-1975  [facility]) CM/SW Contact:  Apollonia Amini, Lenice Llamas Phone Number: [phone]  10/31/2018, 4:35 PM   Clinical Narrative: Clinical Social Worker (CSW) notified Helene Kelp [facility] home health representative that patient will D/C home today. Per [place] [employer] DME agency representative bedside commode has been delivered. Patient is aware of Lovenox price $35. Please reconsult if future social work needs arise. CSW signing off.     Final next level of care: [place] Barriers to Discharge: Barriers Resolved   Patient Goals and CMS Choice Patient states their goals for this hospitalization and ongoing recovery are:: To go home. CMS Medicare.gov Compare Post Acute Care list provided to:: Patient Choice offered to / list presented to : Patient  Discharge Placement                       Discharge Plan and Services In-house Referral: Clinical Social Work Discharge Planning Services: CM Consult Post Acute Care Choice: Home Health          DME Arranged: Bedside commode DME Agency: [employer] Date DME Agency Contacted: 10/30/18 Time DME Agency Contacted: 1502 Representative spoke with at DME Agency: [place]: PT, OT [place] Agency: [facility] (formerly [employer]) Date [place]: 10/31/18 Time [place]: 1635 Representative spoke with at [place]: [place] ([place]) Interventions     Readmission Risk Interventions No flowsheet data found.
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
## 2018-10-31 NOTE — Discharge Instructions (Signed)
ANTERIOR APPROACH TOTAL HIP REPLACEMENT POSTOPERATIVE DIRECTIONS   Hip Rehabilitation, Guidelines Following Surgery  The results of a hip operation are greatly improved after range of motion and muscle strengthening exercises. Follow all safety measures which are given to protect your hip. If any of these exercises cause increased pain or swelling in your joint, decrease the amount until you are comfortable again. Then slowly increase the exercises. Call your caregiver if you have problems or questions.   HOME CARE INSTRUCTIONS  Remove items at home which could result in a fall. This includes throw rugs or furniture in walking pathways.   ICE to the affected hip every three hours for 30 minutes at a time and then as needed for pain and swelling.  Continue to use ice on the hip for pain and swelling from surgery. You may notice swelling that will progress down to the foot and ankle.  This is normal after surgery.  Elevate the leg when you are not up walking on it.    Continue to use the breathing machine which will help keep your temperature down.  It is common for your temperature to cycle up and down following surgery, especially at night when you are not up moving around and exerting yourself.  The breathing machine keeps your lungs expanded and your temperature down.  Do not place pillow under knee, focus on keeping the knee straight while resting  DIET You may resume your previous home diet once your are discharged from the hospital.  DRESSING / WOUND CARE / SHOWERING Please remove provena negative pressure dressing on 11/07/2018 and apply honey comb dressing. Keep dressing clean and dry at all times.   ACTIVITY Walk with your walker as instructed. Use walker as long as suggested by your caregivers. Avoid periods of inactivity such as sitting longer than an hour when not asleep. This helps prevent blood clots.  You may resume a sexual relationship in one month or when given the OK by  your doctor.  You may return to work once you are cleared by your doctor.  Do not drive a car for 6 weeks or until released by you surgeon.  Do not drive while taking narcotics.  WEIGHT BEARING Weight bearing as tolerated. Use walker/cane as needed for at least 4 weeks post op.  POSTOPERATIVE CONSTIPATION PROTOCOL Constipation - defined medically as fewer than three stools per week and severe constipation as less than one stool per week.  One of the most common issues patients have following surgery is constipation.  Even if you have a regular bowel pattern at home, your normal regimen is likely to be disrupted due to multiple reasons following surgery.  Combination of anesthesia, postoperative narcotics, change in appetite and fluid intake all can affect your bowels.  In order to avoid complications following surgery, here are some recommendations in order to help you during your recovery period.  Colace (docusate) - Pick up an over-the-counter form of Colace or another stool softener and take twice a day as long as you are requiring postoperative pain medications.  Take with a full glass of water daily.  If you experience loose stools or diarrhea, hold the colace until you stool forms back up.  If your symptoms do not get better within 1 week or if they get worse, check with your doctor.  Dulcolax (bisacodyl) - Pick up over-the-counter and take as directed by the product packaging as needed to assist with the movement of your bowels.  Take with a  full glass of water.  Use this product as needed if not relieved by Colace only.  ° °MiraLax (polyethylene glycol) - Pick up over-the-counter to have on hand.  MiraLax is a solution that will increase the amount of water in your bowels to assist with bowel movements.  Take as directed and can mix with a glass of water, juice, soda, coffee, or tea.  Take if you go more than two days without a movement. °Do not use MiraLax more than once per day. Call your  doctor if you are still constipated or irregular after using this medication for 7 days in a row. ° °If you continue to have problems with postoperative constipation, please contact the office for further assistance and recommendations.  If you experience "the worst abdominal pain ever" or develop nausea or vomiting, please contact the office immediatly for further recommendations for treatment. ° °ITCHING ° If you experience itching with your medications, try taking only a single pain pill, or even half a pain pill at a time.  You can also use Benadryl over the counter for itching or also to help with sleep.  ° °TED HOSE STOCKINGS °Wear the elastic stockings on both legs for six weeks following surgery during the day but you may remove then at night for sleeping. ° °MEDICATIONS °See your medication summary on the “After Visit Summary” that the nursing staff will review with you prior to discharge.  You may have some home medications which will be placed on hold until you complete the course of blood thinner medication.  It is important for you to complete the blood thinner medication as prescribed by your surgeon.  Continue your approved medications as instructed at time of discharge. ° °PRECAUTIONS °If you experience chest pain or shortness of breath - call 911 immediately for transfer to the hospital emergency department.  °If you develop a fever greater that 101 F, purulent drainage from wound, increased redness or drainage from wound, foul odor from the wound/dressing, or calf pain - CONTACT YOUR SURGEON.   °                                                °FOLLOW-UP APPOINTMENTS °Make sure you keep all of your appointments after your operation with your surgeon and caregivers. You should call the office at the above phone number and make an appointment for approximately two weeks after the date of your surgery or on the date instructed by your surgeon outlined in the "After Visit Summary". ° °RANGE OF MOTION  AND STRENGTHENING EXERCISES  °These exercises are designed to help you keep full movement of your hip joint. Follow your caregiver's or physical therapist's instructions. Perform all exercises about fifteen times, three times per day or as directed. Exercise both hips, even if you have had only one joint replacement. These exercises can be done on a training (exercise) mat, on the floor, on a table or on a bed. Use whatever works the best and is most comfortable for you. Use music or television while you are exercising so that the exercises are a pleasant break in your day. This will make your life better with the exercises acting as a break in routine you can look forward to.  °Lying on your back, slowly slide your foot toward your buttocks, raising your knee up off the floor. Then   slowly slide your foot back down until your leg is straight again.  °Lying on your back spread your legs as far apart as you can without causing discomfort.  °Lying on your side, raise your upper leg and foot straight up from the floor as far as is comfortable. Slowly lower the leg and repeat.  °Lying on your back, tighten up the muscle in the front of your thigh (quadriceps muscles). You can do this by keeping your leg straight and trying to raise your heel off the floor. This helps strengthen the largest muscle supporting your knee.  °Lying on your back, tighten up the muscles of your buttocks both with the legs straight and with the knee bent at a comfortable angle while keeping your heel on the floor.  ° °IF YOU ARE TRANSFERRED TO A SKILLED REHAB FACILITY °If the patient is transferred to a skilled rehab facility following release from the hospital, a list of the current medications will be sent to the facility for the patient to continue.  When discharged from the skilled rehab facility, please have the facility set up the patient's Home Health Physical Therapy prior to being released. Also, the skilled facility will be responsible  for providing the patient with their medications at time of release from the facility to include their pain medication, the muscle relaxants, and their blood thinner medication. If the patient is still at the rehab facility at time of the two week follow up appointment, the skilled rehab facility will also need to assist the patient in arranging follow up appointment in our office and any transportation needs. ° °MAKE SURE YOU:  °Understand these instructions.  °Get help right away if you are not doing well or get worse.  ° ° °Pick up stool softner and laxative for home use following surgery while on pain medications. °Continue to use ice for pain and swelling after surgery. °Do not use any lotions or creams on the incision until instructed by your surgeon. ° °

## 2018-11-17 ENCOUNTER — Emergency Department: Payer: 59

## 2018-11-17 ENCOUNTER — Emergency Department
Admission: EM | Admit: 2018-11-17 | Discharge: 2018-11-17 | Disposition: A | Discharge status: Home / Self Care | Payer: 59 | Attending: Emergency Medicine | Admitting: Emergency Medicine

## 2018-11-17 ENCOUNTER — Encounter: Payer: Self-pay | Admitting: Emergency Medicine

## 2018-11-17 ENCOUNTER — Other Ambulatory Visit: Payer: Self-pay

## 2018-11-17 DIAGNOSIS — R569 Unspecified convulsions: Secondary | ICD-10-CM

## 2018-11-17 DIAGNOSIS — Z96641 Presence of right artificial hip joint: Secondary | ICD-10-CM | POA: Diagnosis not present

## 2018-11-17 DIAGNOSIS — R4182 Altered mental status, unspecified: Secondary | ICD-10-CM | POA: Diagnosis present

## 2018-11-17 LAB — COMPREHENSIVE METABOLIC PANEL
ALT: 17 U/L (ref 0–44)
AST: 18 U/L (ref 15–41)
Albumin: 3.7 g/dL (ref 3.5–5.0)
Alkaline Phosphatase: 131 U/L — ABNORMAL HIGH (ref 38–126)
Anion gap: 8 (ref 5–15)
BUN: 9 mg/dL (ref 6–20)
CO2: 25 mmol/L (ref 22–32)
Calcium: 8.5 mg/dL — ABNORMAL LOW (ref 8.9–10.3)
Chloride: 99 mmol/L (ref 98–111)
Creatinine, Ser: 0.46 mg/dL (ref 0.44–1.00)
GFR calc Af Amer: >60 mL/min (ref >60)
GFR calc non Af Amer: >60 mL/min (ref >60)
Glucose, Bld: 122 mg/dL — ABNORMAL HIGH (ref 70–99)
Potassium: 3.8 mmol/L (ref 3.5–5.1)
Sodium: 132 mmol/L — ABNORMAL LOW (ref 135–145)
Total Bilirubin: 0.5 mg/dL (ref 0.3–1.2)
Total Protein: 6.9 g/dL (ref 6.5–8.1)

## 2018-11-17 LAB — URINALYSIS, COMPLETE (UACMP) WITH MICROSCOPIC
Bacteria, UA: NONE SEEN
Bilirubin Urine: NEGATIVE
Glucose, UA: NEGATIVE mg/dL
Hgb urine dipstick: NEGATIVE
Ketones, ur: NEGATIVE mg/dL
Leukocytes,Ua: NEGATIVE
Nitrite: NEGATIVE
Protein, ur: NEGATIVE mg/dL
Specific Gravity, Urine: 1.008 (ref 1.005–1.030)
pH: 7 (ref 5.0–8.0)

## 2018-11-17 LAB — CBC WITH DIFFERENTIAL/PLATELET
Abs Immature Granulocytes: 0.02 10*3/uL (ref 0.00–0.07)
Basophils Absolute: 0.1 10*3/uL (ref 0.0–0.1)
Basophils Relative: 1 %
Eosinophils Absolute: 0 10*3/uL (ref 0.0–0.5)
Eosinophils Relative: 1 %
HCT: 33.8 % — ABNORMAL LOW (ref 36.0–46.0)
Hemoglobin: 11.3 g/dL — ABNORMAL LOW (ref 12.0–15.0)
Immature Granulocytes: 0 %
Lymphocytes Relative: 17 %
Lymphs Abs: 0.9 10*3/uL (ref 0.7–4.0)
MCH: 32.2 pg (ref 26.0–34.0)
MCHC: 33.4 g/dL (ref 30.0–36.0)
MCV: 96.3 fL (ref 80.0–100.0)
Monocytes Absolute: 0.5 10*3/uL (ref 0.1–1.0)
Monocytes Relative: 9 %
Neutro Abs: 3.8 10*3/uL (ref 1.7–7.7)
Neutrophils Relative %: 72 %
Platelets: 301 10*3/uL (ref 150–400)
RBC: 3.51 MIL/uL — ABNORMAL LOW (ref 3.87–5.11)
RDW: 12.7 % (ref 11.5–15.5)
WBC: 5.3 10*3/uL (ref 4.0–10.5)
nRBC: 0 % (ref 0.0–0.2)

## 2018-11-17 LAB — URINE DRUG SCREEN, QUALITATIVE (ARMC ONLY)
Amphetamines, Ur Screen: NOT DETECTED
Barbiturates, Ur Screen: NOT DETECTED
Benzodiazepine, Ur Scrn: NOT DETECTED
Cannabinoid 50 Ng, Ur ~~LOC~~: NOT DETECTED
Cocaine Metabolite,Ur ~~LOC~~: NOT DETECTED
MDMA (Ecstasy)Ur Screen: NOT DETECTED
Methadone Scn, Ur: NOT DETECTED
Opiate, Ur Screen: NOT DETECTED
Phencyclidine (PCP) Ur S: NOT DETECTED
Tricyclic, Ur Screen: NOT DETECTED

## 2018-11-17 LAB — ETHANOL: Alcohol, Ethyl (B): <10 mg/dL (ref <10)

## 2018-11-17 LAB — PHENYTOIN LEVEL, TOTAL: Phenytoin Lvl: 8.6 ug/mL — ABNORMAL LOW (ref 10.0–20.0)

## 2018-11-17 MED ORDER — LEVETIRACETAM IN NACL 1000 MG/100ML IV SOLN
1000.0000 mg | Freq: Once | INTRAVENOUS | Status: AC
  Administered 2018-11-17: 1000 mg via INTRAVENOUS
  Filled 2018-11-17: qty 100

## 2018-11-17 NOTE — ED Notes (Signed)
Patient transported to CT 

## 2018-11-17 NOTE — ED Triage Notes (Signed)
Patient wheelchair to triage in room 5 with complaints of seizure at home per mother.  Pt has hx of same and takes generic for dilantin dailey.  Pt had right hip on 1 sept     Pt appears confused; able to move to bed from wheelchair with pressure but can't follow directions does better answering direct questions
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
## 2018-11-17 NOTE — ED Notes (Signed)
Family at bedside. 

## 2018-11-17 NOTE — ED Notes (Signed)
Patient transported back from CT and X-ray at bedside att

## 2018-11-17 NOTE — ED Notes (Signed)
Pt interacting and no delay to response

## 2018-11-19 LAB — LEVETIRACETAM LEVEL: Levetiracetam Lvl: 18.6 ug/mL (ref 10.0–40.0)

## 2019-04-24 ENCOUNTER — Other Ambulatory Visit: Payer: Self-pay | Admitting: Obstetrics and Gynecology

## 2019-04-24 DIAGNOSIS — Z1231 Encounter for screening mammogram for malignant neoplasm of breast: Secondary | ICD-10-CM

## 2019-04-26 ENCOUNTER — Ambulatory Visit: Payer: PRIVATE HEALTH INSURANCE | Attending: Internal Medicine

## 2019-04-26 DIAGNOSIS — Z23 Encounter for immunization: Secondary | ICD-10-CM | POA: Insufficient documentation

## 2019-04-26 NOTE — Progress Notes (Signed)
   Covid-19 Vaccination Clinic  Name:  Tara Carlson    MRN: [id number] DOB: September 29, 1975  04/26/2019  Tara Carlson was observed post Covid-19 immunization for 15 minutes without incidence. She was provided with Vaccine Information Sheet and instruction to access the V-Safe system.   Tara Carlson was instructed to call 911 with any severe reactions post vaccine: Marland Kitchen Difficulty breathing  . Swelling of your face and throat  . A fast heartbeat  . A bad rash all over your body  . Dizziness and weakness    Immunizations Administered    Name Date Dose VIS Date Route   Moderna COVID-19 Vaccine 04/26/2019 10:33 AM 0.5 mL 01/28/2019 Intramuscular   Manufacturer: Moderna   Lot: [id number]   NDC: 25525-894-83

## 2019-05-24 ENCOUNTER — Ambulatory Visit: Payer: PRIVATE HEALTH INSURANCE | Attending: Internal Medicine

## 2019-05-24 DIAGNOSIS — Z23 Encounter for immunization: Secondary | ICD-10-CM

## 2019-05-24 NOTE — Progress Notes (Signed)
   Covid-19 Vaccination Clinic  Name:  Tara Carlson    MRN: [id number] DOB: 30-Sep-1975  05/24/2019  [name] was observed post Covid-19 immunization for 15 minutes without incident. She was provided with Vaccine Information Sheet and instruction to access the V-Safe system.   [name] was instructed to call 911 with any severe reactions post vaccine: Marland Kitchen Difficulty breathing  . Swelling of face and throat  . A fast heartbeat  . A bad rash all over body  . Dizziness and weakness   Immunizations Administered    Name Date Dose VIS Date Route   Moderna COVID-19 Vaccine 05/24/2019  2:33 PM 0.5 mL 01/28/2019 Intramuscular   Manufacturer: Gala Murdoch   Lot: [id number]   NDC: 09735-329-92

## 2019-05-27 ENCOUNTER — Ambulatory Visit: Payer: PRIVATE HEALTH INSURANCE

## 2020-08-21 IMAGING — XA DG HIP (WITH PELVIS) OPERATIVE*R*
2 series · 2 of 2 positions shown · non-contrast
Comparison: None.

CLINICAL DATA: Status post right hip replacement.

EXAM:
OPERATIVE right HIP (WITH PELVIS IF PERFORMED) 2 VIEWS
TECHNIQUE: Fluoroscopic spot image(s) were submitted for interpretation
post-operatively.
Radiation exposure index: 7.4 mGy.

[Series 2: ortho standard · 1 of 1 slices shown (1 of 2)]
[im 1/1]
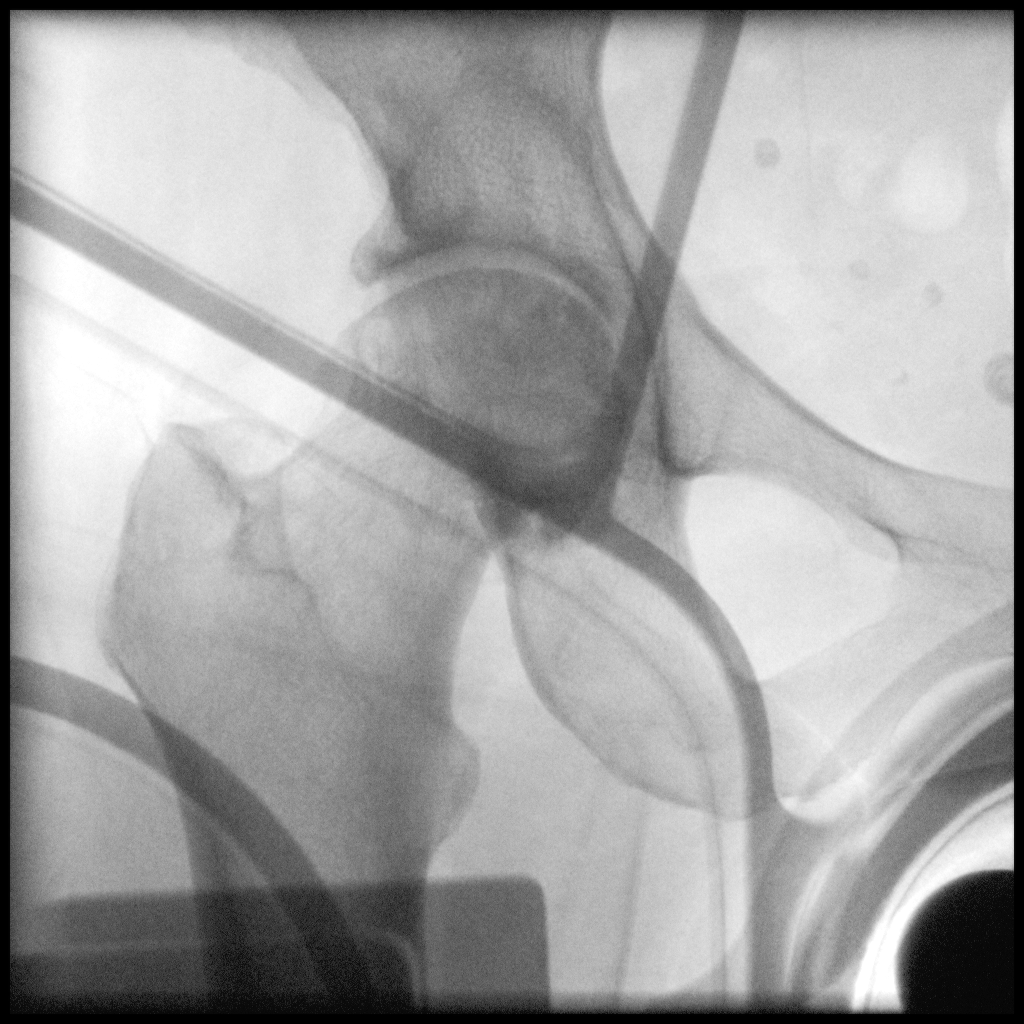

[Series 7: ortho standard · 1 of 1 slices shown (2 of 2)]
[im 1/1]
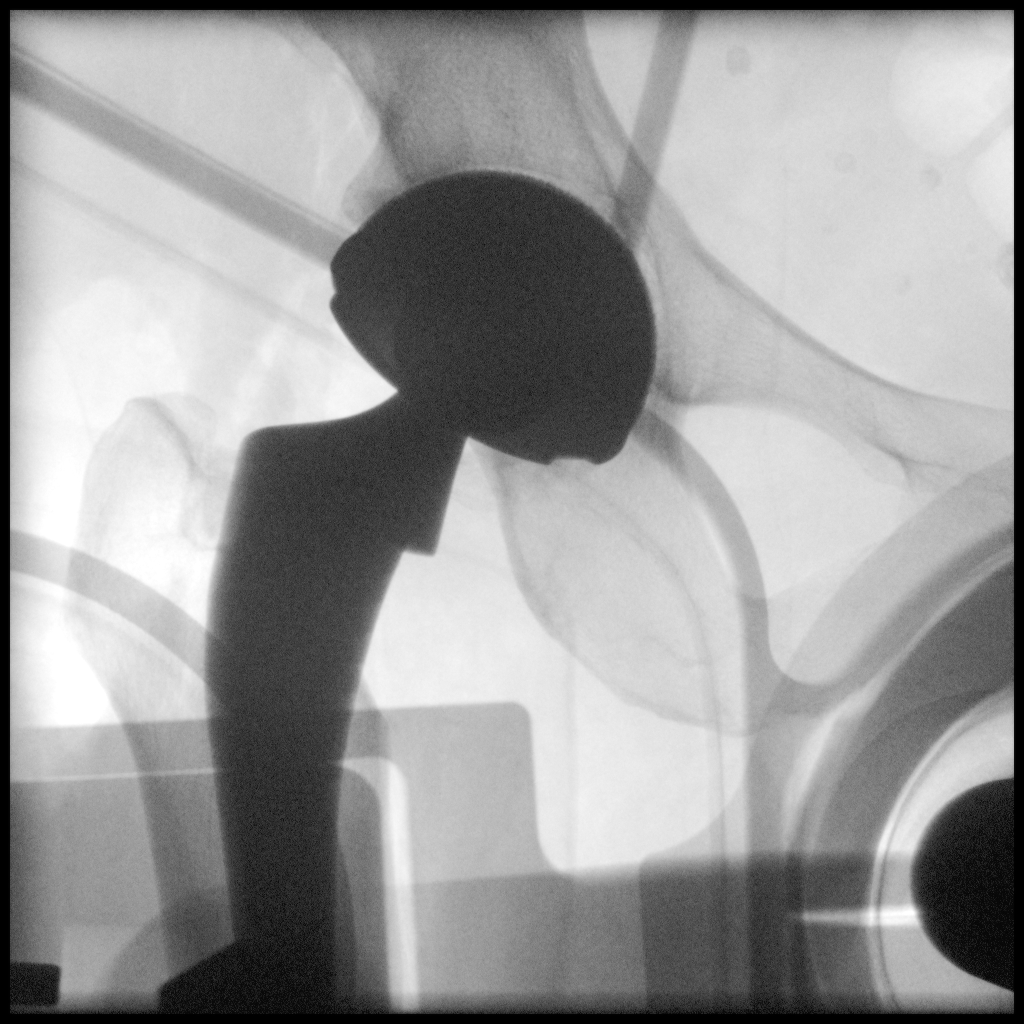

[2 of 2 positions shown; findings below may reference images not displayed]

FINDINGS: Two intraoperative fluoroscopic images demonstrate the right femoral
and acetabular components to be well situated.
IMPRESSION: Fluoroscopic guidance provided during total right hip arthroplasty.

## 2021-01-18 ENCOUNTER — Other Ambulatory Visit: Payer: Self-pay | Admitting: Orthopedic Surgery

## 2021-02-01 ENCOUNTER — Other Ambulatory Visit: Payer: Self-pay

## 2021-02-01 ENCOUNTER — Encounter
Admission: RE | Admit: 2021-02-01 | Discharge: 2021-02-01 | Disposition: A | Discharge status: Home / Self Care | Payer: 59 | Source: Ambulatory Visit | Attending: Orthopedic Surgery | Admitting: Orthopedic Surgery

## 2021-02-01 VITALS — BP 125/79 | HR 78 | Temp 98.1°F | Resp 18 | Ht 69.0 in | Wt 181.9 lb

## 2021-02-01 DIAGNOSIS — Z01818 Encounter for other preprocedural examination: Secondary | ICD-10-CM | POA: Insufficient documentation

## 2021-02-01 DIAGNOSIS — R8281 Pyuria: Secondary | ICD-10-CM | POA: Insufficient documentation

## 2021-02-01 DIAGNOSIS — Z01812 Encounter for preprocedural laboratory examination: Secondary | ICD-10-CM

## 2021-02-01 LAB — URINALYSIS, ROUTINE W REFLEX MICROSCOPIC
Bacteria, UA: NONE SEEN
Bilirubin Urine: NEGATIVE
Glucose, UA: NEGATIVE mg/dL
Hgb urine dipstick: NEGATIVE
Ketones, ur: NEGATIVE mg/dL
Nitrite: NEGATIVE
Protein, ur: NEGATIVE mg/dL
Specific Gravity, Urine: 1.01 (ref 1.005–1.030)
pH: 8 (ref 5.0–8.0)

## 2021-02-01 LAB — CBC WITH DIFFERENTIAL/PLATELET
Abs Immature Granulocytes: 0 10*3/uL (ref 0.00–0.07)
Basophils Absolute: 0 10*3/uL (ref 0.0–0.1)
Basophils Relative: 1 %
Eosinophils Absolute: 0.1 10*3/uL (ref 0.0–0.5)
Eosinophils Relative: 4 %
HCT: 39.3 % (ref 36.0–46.0)
Hemoglobin: 12.9 g/dL (ref 12.0–15.0)
Immature Granulocytes: 0 %
Lymphocytes Relative: 34 %
Lymphs Abs: 1.1 10*3/uL (ref 0.7–4.0)
MCH: 31.2 pg (ref 26.0–34.0)
MCHC: 32.8 g/dL (ref 30.0–36.0)
MCV: 95.2 fL (ref 80.0–100.0)
Monocytes Absolute: 0.4 10*3/uL (ref 0.1–1.0)
Monocytes Relative: 12 %
Neutro Abs: 1.5 10*3/uL — ABNORMAL LOW (ref 1.7–7.7)
Neutrophils Relative %: 49 %
Platelets: 185 10*3/uL (ref 150–400)
RBC: 4.13 MIL/uL (ref 3.87–5.11)
RDW: 11.9 % (ref 11.5–15.5)
WBC: 3.1 10*3/uL — ABNORMAL LOW (ref 4.0–10.5)
nRBC: 0 % (ref 0.0–0.2)

## 2021-02-01 LAB — TYPE AND SCREEN
ABO/RH(D): O NEG
Antibody Screen: NEGATIVE

## 2021-02-01 LAB — COMPREHENSIVE METABOLIC PANEL
ALT: 20 U/L (ref 0–44)
AST: 23 U/L (ref 15–41)
Albumin: 4.1 g/dL (ref 3.5–5.0)
Alkaline Phosphatase: 109 U/L (ref 38–126)
Anion gap: 5 (ref 5–15)
BUN: 10 mg/dL (ref 6–20)
CO2: 30 mmol/L (ref 22–32)
Calcium: 8.8 mg/dL — ABNORMAL LOW (ref 8.9–10.3)
Chloride: 103 mmol/L (ref 98–111)
Creatinine, Ser: 0.53 mg/dL (ref 0.44–1.00)
GFR, Estimated: >60 mL/min (ref >60)
Glucose, Bld: 73 mg/dL (ref 70–99)
Potassium: 4.5 mmol/L (ref 3.5–5.1)
Sodium: 138 mmol/L (ref 135–145)
Total Bilirubin: 0.5 mg/dL (ref 0.3–1.2)
Total Protein: 6.8 g/dL (ref 6.5–8.1)

## 2021-02-01 LAB — SURGICAL PCR SCREEN
MRSA, PCR: NEGATIVE
Staphylococcus aureus: NEGATIVE

## 2021-02-01 NOTE — Patient Instructions (Addendum)
Your procedure is scheduled on: Thursday 02/10/21 Report to the Registration Desk on the 1st floor of the [facility]. To find out your arrival time, please call [phone] between 1PM - 3PM on: Wednesday 02/09/21  REMEMBER: Instructions that are not followed completely may result in serious medical risk, up to and including death; or upon the discretion of your surgeon and anesthesiologist your surgery may need to be rescheduled.  Do not eat food after midnight the night before surgery.  No gum chewing, lozengers or hard candies.  You may however, drink CLEAR liquids up to 2 hours before you are scheduled to arrive for your surgery. Do not drink anything within 2 hours of your scheduled arrival time.  Clear liquids include: - water  - apple juice without pulp - gatorade (not RED, PURPLE, OR BLUE) - black coffee or tea (Do NOT add milk or creamers to the coffee or tea) Do NOT drink anything that is not on this list.  In addition, your doctor has ordered for you to drink the provided  Ensure Pre-Surgery Clear Carbohydrate Drink  Drinking this carbohydrate drink up to two hours before surgery helps to reduce insulin resistance and improve patient outcomes. Please complete drinking 2 hours prior to scheduled arrival time.  TAKE THESE MEDICATIONS THE MORNING OF SURGERY WITH A SIP OF WATER: levETIRAcetam (KEPPRA) 750 MG tablet phenytoin (DILANTIN) 100 MG ER capsule  One week prior to surgery: Stop Anti-inflammatories (NSAIDS) such as Advil, Aleve, Ibuprofen, Motrin, Naproxen, Naprosyn and Aspirin based products such as Excedrin, Goodys Powder, BC Powder. Stop taking your Cholecalciferol (VITAMIN D) 50 MCG (2000 UT) CAPS,  folic acid (FOLVITE) 400 MCG tablet, magnesium oxide (MAG-OX) 400 MG tablet, Multiple Vitamins-Minerals (MULTIVITAMIN WITH MINERALS) tablet and ANY other OVER THE COUNTER supplements until after surgery. You may however, continue to take Tylenol if needed for pain up  until the day of surgery.  No Alcohol for 24 hours before or after surgery.  No Smoking including e-cigarettes for 24 hours prior to surgery.  No chewable tobacco products for at least 6 hours prior to surgery.  No nicotine patches on the day of surgery.  Do not use any "recreational" drugs for at least a week prior to your surgery.  Please be advised that the combination of cocaine and anesthesia may have negative outcomes, up to and including death. If you test positive for cocaine, your surgery will be cancelled.  On the morning of surgery brush your teeth with toothpaste and water, you may rinse your mouth with mouthwash if you wish. Do not swallow any toothpaste or mouthwash.  Use CHG Soap as directed on instruction sheet.  Do not wear jewelry, make-up, hairpins, clips or nail polish.  Do not wear lotions, powders, or perfumes.   Do not shave body from the neck down 48 hours prior to surgery just in case you cut yourself which could leave a site for infection.  Also, freshly shaved skin may become irritated if using the CHG soap.  Do not bring valuables to the hospital. [facility] is not responsible for any missing/lost belongings or valuables.   Notify your doctor if there is any change in your medical condition (cold, fever, infection).  Wear comfortable clothing (specific to your surgery type) to the hospital.  After surgery, you can help prevent lung complications by doing breathing exercises.  Take deep breaths and cough every 1-2 hours. Your doctor may order a device called an Incentive Spirometer to help you take  deep breaths.  If you are being admitted to the hospital overnight, leave your suitcase in the car. After surgery it may be brought to your room.  If you are taking public transportation, you will need to have a responsible adult (18 years or older) with you. Please confirm with your physician that it is acceptable to use public transportation.   Please  call the Pre-admissions Testing Dept. at [phone] if you have any questions about these instructions.  Surgery Visitation Policy:  Patients undergoing a surgery or procedure may have one family member or support person with them as long as that person is not COVID-19 positive or experiencing its symptoms.  That person may remain in the waiting area during the procedure and may rotate out with other people.  Inpatient Visitation:    Visiting hours are 7 a.m. to 8 p.m. Up to two visitors ages 12+ are allowed at one time in a patient room. The visitors may rotate out with other people during the day. Visitors must check out when they leave, or other visitors will not be allowed. One designated support person may remain overnight. The visitor must pass COVID-19 screenings, use hand sanitizer when entering and exiting the patient's room and wear a mask at all times, including in the patient's room. Patients must also wear a mask when staff or their visitor are in the room. Masking is required regardless of vaccination status.

## 2021-02-03 LAB — URINE CULTURE: Culture: 50000 — AB

## 2021-02-08 ENCOUNTER — Other Ambulatory Visit
Admission: RE | Admit: 2021-02-08 | Discharge: 2021-02-08 | Disposition: A | Discharge status: Home / Self Care | Payer: 59 | Source: Ambulatory Visit | Attending: Orthopedic Surgery | Admitting: Orthopedic Surgery

## 2021-02-08 ENCOUNTER — Other Ambulatory Visit: Payer: Self-pay

## 2021-02-08 DIAGNOSIS — Z01812 Encounter for preprocedural laboratory examination: Secondary | ICD-10-CM | POA: Diagnosis present

## 2021-02-08 DIAGNOSIS — Z20822 Contact with and (suspected) exposure to covid-19: Secondary | ICD-10-CM | POA: Insufficient documentation

## 2021-02-09 LAB — SARS CORONAVIRUS 2 (TAT 6-24 HRS): SARS Coronavirus 2: NEGATIVE

## 2021-02-10 ENCOUNTER — Encounter: Payer: Self-pay | Admitting: Orthopedic Surgery

## 2021-02-10 ENCOUNTER — Encounter
Admission: RE | Disposition: A | Discharge status: Home / Self Care | Payer: Self-pay | Source: Home / Self Care | Attending: Orthopedic Surgery

## 2021-02-10 ENCOUNTER — Observation Stay: Payer: 59

## 2021-02-10 ENCOUNTER — Ambulatory Visit: Payer: 59 | Admitting: Urgent Care

## 2021-02-10 ENCOUNTER — Ambulatory Visit: Payer: 59 | Admitting: Anesthesiology

## 2021-02-10 ENCOUNTER — Other Ambulatory Visit: Payer: Self-pay

## 2021-02-10 ENCOUNTER — Ambulatory Visit: Payer: 59

## 2021-02-10 ENCOUNTER — Observation Stay
Admission: RE | Admit: 2021-02-10 | Discharge: 2021-02-12 | Disposition: A | Discharge status: Home / Self Care | Payer: 59 | Attending: Orthopedic Surgery | Admitting: Orthopedic Surgery

## 2021-02-10 DIAGNOSIS — M1612 Unilateral primary osteoarthritis, left hip: Secondary | ICD-10-CM | POA: Diagnosis present

## 2021-02-10 DIAGNOSIS — G8918 Other acute postprocedural pain: Secondary | ICD-10-CM

## 2021-02-10 DIAGNOSIS — R569 Unspecified convulsions: Secondary | ICD-10-CM | POA: Insufficient documentation

## 2021-02-10 DIAGNOSIS — Z96642 Presence of left artificial hip joint: Secondary | ICD-10-CM

## 2021-02-10 DIAGNOSIS — Z419 Encounter for procedure for purposes other than remedying health state, unspecified: Secondary | ICD-10-CM

## 2021-02-10 DIAGNOSIS — Z79899 Other long term (current) drug therapy: Secondary | ICD-10-CM | POA: Diagnosis not present

## 2021-02-10 HISTORY — PX: TOTAL HIP ARTHROPLASTY: SHX124

## 2021-02-10 LAB — CREATININE, SERUM
Creatinine, Ser: 0.45 mg/dL (ref 0.44–1.00)
GFR, Estimated: >60 mL/min (ref >60)

## 2021-02-10 LAB — CBC
HCT: 30.4 % — ABNORMAL LOW (ref 36.0–46.0)
Hemoglobin: 10.4 g/dL — ABNORMAL LOW (ref 12.0–15.0)
MCH: 32.1 pg (ref 26.0–34.0)
MCHC: 34.2 g/dL (ref 30.0–36.0)
MCV: 93.8 fL (ref 80.0–100.0)
Platelets: 153 10*3/uL (ref 150–400)
RBC: 3.24 MIL/uL — ABNORMAL LOW (ref 3.87–5.11)
RDW: 11.8 % (ref 11.5–15.5)
WBC: 8.8 10*3/uL (ref 4.0–10.5)
nRBC: 0 % (ref 0.0–0.2)

## 2021-02-10 LAB — POCT PREGNANCY, URINE: Preg Test, Ur: NEGATIVE

## 2021-02-10 LAB — GLUCOSE, CAPILLARY
Glucose-Capillary: 67 mg/dL — ABNORMAL LOW (ref 70–99)
Glucose-Capillary: 78 mg/dL (ref 70–99)

## 2021-02-10 SURGERY — ARTHROPLASTY, HIP, TOTAL, ANTERIOR APPROACH
Anesthesia: Spinal | Site: Hip | Laterality: Left

## 2021-02-10 MED ORDER — OXYCODONE HCL 5 MG PO TABS
ORAL_TABLET | ORAL | Status: AC
  Administered 2021-02-10: 5 mg via ORAL
  Filled 2021-02-10: qty 1

## 2021-02-10 MED ORDER — ADULT MULTIVITAMIN W/MINERALS CH
1.0000 | ORAL_TABLET | Freq: Two times a day (BID) | ORAL | Status: DC
  Administered 2021-02-10 – 2021-02-12 (×3): 1 via ORAL
  Filled 2021-02-10 (×4): qty 1

## 2021-02-10 MED ORDER — FENTANYL CITRATE (PF) 100 MCG/2ML IJ SOLN
INTRAMUSCULAR | Status: AC
  Administered 2021-02-10: 25 ug via INTRAVENOUS
  Filled 2021-02-10: qty 2

## 2021-02-10 MED ORDER — LEVETIRACETAM 750 MG PO TABS
750.0000 mg | ORAL_TABLET | Freq: Two times a day (BID) | ORAL | Status: DC
  Administered 2021-02-10 – 2021-02-12 (×4): 750 mg via ORAL
  Filled 2021-02-10 (×5): qty 1

## 2021-02-10 MED ORDER — HEMOSTATIC AGENTS (NO CHARGE) OPTIME
TOPICAL | Status: DC | PRN
  Administered 2021-02-10: 2 via TOPICAL

## 2021-02-10 MED ORDER — BUPIVACAINE-EPINEPHRINE 0.25% -1:200000 IJ SOLN
INTRAMUSCULAR | Status: DC | PRN
  Administered 2021-02-10: 30 mL

## 2021-02-10 MED ORDER — PHENOL 1.4 % MT LIQD
1.0000 | OROMUCOSAL | Status: DC | PRN
  Filled 2021-02-10: qty 177

## 2021-02-10 MED ORDER — FLEET ENEMA 7-19 GM/118ML RE ENEM: 1.0000 | ENEMA | Freq: Once | RECTAL | Status: DC | PRN

## 2021-02-10 MED ORDER — ORAL CARE MOUTH RINSE: 15.0000 mL | Freq: Once | OROMUCOSAL | Status: AC

## 2021-02-10 MED ORDER — FAMOTIDINE 20 MG PO TABS
ORAL_TABLET | ORAL | Status: AC
  Administered 2021-02-10: 20 mg via ORAL
  Filled 2021-02-10: qty 1

## 2021-02-10 MED ORDER — DOCUSATE SODIUM 100 MG PO CAPS
100.0000 mg | ORAL_CAPSULE | Freq: Two times a day (BID) | ORAL | Status: DC
  Administered 2021-02-10 – 2021-02-12 (×4): 100 mg via ORAL
  Filled 2021-02-10 (×4): qty 1

## 2021-02-10 MED ORDER — METHOCARBAMOL 500 MG PO TABS
ORAL_TABLET | ORAL | Status: AC
  Filled 2021-02-10: qty 1

## 2021-02-10 MED ORDER — MORPHINE SULFATE (PF) 2 MG/ML IV SOLN
INTRAVENOUS | Status: AC
  Filled 2021-02-10: qty 1

## 2021-02-10 MED ORDER — CHLORHEXIDINE GLUCONATE 0.12 % MT SOLN
OROMUCOSAL | Status: AC
  Administered 2021-02-10: 15 mL via OROMUCOSAL
  Filled 2021-02-10: qty 15

## 2021-02-10 MED ORDER — METHOCARBAMOL 500 MG PO TABS
500.0000 mg | ORAL_TABLET | Freq: Four times a day (QID) | ORAL | Status: DC | PRN
  Administered 2021-02-10 – 2021-02-12 (×5): 500 mg via ORAL
  Filled 2021-02-10 (×4): qty 1

## 2021-02-10 MED ORDER — CHLORHEXIDINE GLUCONATE 0.12 % MT SOLN: 15.0000 mL | Freq: Once | OROMUCOSAL | Status: AC

## 2021-02-10 MED ORDER — ONDANSETRON HCL 4 MG/2ML IJ SOLN: 4.0000 mg | Freq: Four times a day (QID) | INTRAMUSCULAR | Status: DC | PRN

## 2021-02-10 MED ORDER — ONDANSETRON HCL 4 MG PO TABS
4.0000 mg | ORAL_TABLET | Freq: Four times a day (QID) | ORAL | Status: DC | PRN
  Administered 2021-02-10: 4 mg via ORAL
  Filled 2021-02-10: qty 1

## 2021-02-10 MED ORDER — FAMOTIDINE 20 MG PO TABS: 20.0000 mg | ORAL_TABLET | Freq: Once | ORAL | Status: AC

## 2021-02-10 MED ORDER — PHENYTOIN SODIUM EXTENDED 100 MG PO CAPS
300.0000 mg | ORAL_CAPSULE | Freq: Every day | ORAL | Status: DC
  Administered 2021-02-10 – 2021-02-11 (×2): 300 mg via ORAL
  Filled 2021-02-10 (×3): qty 3

## 2021-02-10 MED ORDER — PHENYLEPHRINE 40 MCG/ML (10ML) SYRINGE FOR IV PUSH (FOR BLOOD PRESSURE SUPPORT)
PREFILLED_SYRINGE | INTRAVENOUS | Status: DC | PRN
  Administered 2021-02-10: 80 ug via INTRAVENOUS

## 2021-02-10 MED ORDER — OXYCODONE HCL 5 MG/5ML PO SOLN: 5.0000 mg | Freq: Once | ORAL | Status: AC | PRN

## 2021-02-10 MED ORDER — MAGNESIUM OXIDE 400 MG PO TABS
400.0000 mg | ORAL_TABLET | Freq: Two times a day (BID) | ORAL | Status: DC
  Administered 2021-02-10 – 2021-02-12 (×4): 400 mg via ORAL
  Filled 2021-02-10 (×9): qty 1

## 2021-02-10 MED ORDER — METHOCARBAMOL 1000 MG/10ML IJ SOLN
500.0000 mg | Freq: Four times a day (QID) | INTRAVENOUS | Status: DC | PRN
  Filled 2021-02-10: qty 5

## 2021-02-10 MED ORDER — POLYETHYLENE GLYCOL 3350 17 G PO PACK: 17.0000 g | PACK | Freq: Every day | ORAL | Status: DC | PRN

## 2021-02-10 MED ORDER — PHENYLEPHRINE HCL-NACL 20-0.9 MG/250ML-% IV SOLN
INTRAVENOUS | Status: DC | PRN
  Administered 2021-02-10: 50 ug/min via INTRAVENOUS

## 2021-02-10 MED ORDER — ALUM & MAG HYDROXIDE-SIMETH 200-200-20 MG/5ML PO SUSP: 30.0000 mL | ORAL | Status: DC | PRN

## 2021-02-10 MED ORDER — PANTOPRAZOLE SODIUM 40 MG PO TBEC
40.0000 mg | DELAYED_RELEASE_TABLET | Freq: Every day | ORAL | Status: DC
  Administered 2021-02-10 – 2021-02-12 (×3): 40 mg via ORAL
  Filled 2021-02-10 (×3): qty 1

## 2021-02-10 MED ORDER — BUPIVACAINE-EPINEPHRINE (PF) 0.25% -1:200000 IJ SOLN
INTRAMUSCULAR | Status: AC
  Filled 2021-02-10: qty 30

## 2021-02-10 MED ORDER — PHENYTOIN SODIUM EXTENDED 100 MG PO CAPS
200.0000 mg | ORAL_CAPSULE | ORAL | Status: DC
Start: 2021-02-10 — End: 2021-02-10

## 2021-02-10 MED ORDER — ZOLPIDEM TARTRATE 5 MG PO TABS
5.0000 mg | ORAL_TABLET | Freq: Every evening | ORAL | Status: DC | PRN
  Administered 2021-02-11 – 2021-02-12 (×2): 5 mg via ORAL
  Filled 2021-02-10 (×2): qty 1

## 2021-02-10 MED ORDER — HYDROCODONE-ACETAMINOPHEN 7.5-325 MG PO TABS
1.0000 | ORAL_TABLET | ORAL | Status: DC | PRN
  Administered 2021-02-10 – 2021-02-11 (×2): 2 via ORAL
  Filled 2021-02-10 (×2): qty 2

## 2021-02-10 MED ORDER — BISACODYL 10 MG RE SUPP: 10.0000 mg | Freq: Every day | RECTAL | Status: DC | PRN

## 2021-02-10 MED ORDER — CEFAZOLIN SODIUM-DEXTROSE 2-4 GM/100ML-% IV SOLN
2.0000 g | Freq: Four times a day (QID) | INTRAVENOUS | Status: AC
  Administered 2021-02-10: 2 g via INTRAVENOUS
  Filled 2021-02-10: qty 100

## 2021-02-10 MED ORDER — CEFAZOLIN SODIUM-DEXTROSE 2-4 GM/100ML-% IV SOLN
INTRAVENOUS | Status: AC
  Filled 2021-02-10: qty 100

## 2021-02-10 MED ORDER — ACETAMINOPHEN 325 MG PO TABS
325.0000 mg | ORAL_TABLET | Freq: Four times a day (QID) | ORAL | Status: DC | PRN
  Administered 2021-02-11 – 2021-02-12 (×4): 650 mg via ORAL
  Filled 2021-02-10 (×4): qty 2

## 2021-02-10 MED ORDER — CEFAZOLIN SODIUM-DEXTROSE 2-4 GM/100ML-% IV SOLN
2.0000 g | INTRAVENOUS | Status: AC
  Administered 2021-02-10: 2 g via INTRAVENOUS

## 2021-02-10 MED ORDER — DIPHENHYDRAMINE HCL 12.5 MG/5ML PO ELIX: 12.5000 mg | ORAL_SOLUTION | ORAL | Status: DC | PRN

## 2021-02-10 MED ORDER — MORPHINE SULFATE (PF) 4 MG/ML IV SOLN
0.5000 mg | INTRAVENOUS | Status: DC | PRN
  Administered 2021-02-10: 1 mg via INTRAVENOUS
  Administered 2021-02-10: 0.52 mg via INTRAVENOUS
  Administered 2021-02-10 – 2021-02-12 (×6): 1 mg via INTRAVENOUS
  Filled 2021-02-10 (×8): qty 1

## 2021-02-10 MED ORDER — PROPOFOL 500 MG/50ML IV EMUL
INTRAVENOUS | Status: DC | PRN
  Administered 2021-02-10: 200 ug/kg/min via INTRAVENOUS

## 2021-02-10 MED ORDER — BUPIVACAINE HCL (PF) 0.5 % IJ SOLN
INTRAMUSCULAR | Status: DC | PRN
  Administered 2021-02-10: 3 mL

## 2021-02-10 MED ORDER — SODIUM CHLORIDE 0.9 % IR SOLN
Status: DC | PRN
  Administered 2021-02-10: 4 mL

## 2021-02-10 MED ORDER — LACTATED RINGERS IV SOLN: INTRAVENOUS | Status: DC

## 2021-02-10 MED ORDER — PHENYTOIN SODIUM EXTENDED 100 MG PO CAPS
200.0000 mg | ORAL_CAPSULE | Freq: Every day | ORAL | Status: DC
  Administered 2021-02-11 – 2021-02-12 (×2): 200 mg via ORAL
  Filled 2021-02-10 (×2): qty 2

## 2021-02-10 MED ORDER — OXYCODONE HCL 5 MG PO TABS: 5.0000 mg | ORAL_TABLET | Freq: Once | ORAL | Status: AC | PRN

## 2021-02-10 MED ORDER — PROPOFOL 1000 MG/100ML IV EMUL
INTRAVENOUS | Status: AC
  Filled 2021-02-10: qty 100

## 2021-02-10 MED ORDER — HYDROCODONE-ACETAMINOPHEN 5-325 MG PO TABS: 1.0000 | ORAL_TABLET | ORAL | Status: DC | PRN

## 2021-02-10 MED ORDER — EPHEDRINE SULFATE 50 MG/ML IJ SOLN
INTRAMUSCULAR | Status: DC | PRN
  Administered 2021-02-10 (×2): 10 mg via INTRAVENOUS

## 2021-02-10 MED ORDER — MIDAZOLAM HCL 5 MG/5ML IJ SOLN
INTRAMUSCULAR | Status: DC | PRN
Start: 2021-02-10 — End: 2021-02-10
  Administered 2021-02-10: 2 mg via INTRAVENOUS

## 2021-02-10 MED ORDER — NEOMYCIN-POLYMYXIN B GU 40-200000 IR SOLN
Status: AC
  Filled 2021-02-10: qty 4

## 2021-02-10 MED ORDER — FENTANYL CITRATE (PF) 100 MCG/2ML IJ SOLN
25.0000 ug | INTRAMUSCULAR | Status: AC | PRN
  Administered 2021-02-10: 50 ug via INTRAVENOUS
  Administered 2021-02-10: 25 ug via INTRAVENOUS
  Administered 2021-02-10 (×2): 50 ug via INTRAVENOUS

## 2021-02-10 MED ORDER — MIDAZOLAM HCL 2 MG/2ML IJ SOLN
INTRAMUSCULAR | Status: AC
  Filled 2021-02-10: qty 2

## 2021-02-10 MED ORDER — SODIUM CHLORIDE 0.9 % IV SOLN: INTRAVENOUS | Status: DC

## 2021-02-10 MED ORDER — MENTHOL 3 MG MT LOZG
1.0000 | LOZENGE | OROMUCOSAL | Status: DC | PRN
  Filled 2021-02-10: qty 9

## 2021-02-10 MED ORDER — ENOXAPARIN SODIUM 40 MG/0.4ML IJ SOSY
40.0000 mg | PREFILLED_SYRINGE | INTRAMUSCULAR | Status: DC
  Administered 2021-02-11 – 2021-02-12 (×2): 40 mg via SUBCUTANEOUS
  Filled 2021-02-10 (×2): qty 0.4

## 2021-02-10 MED ORDER — CEFAZOLIN SODIUM-DEXTROSE 2-4 GM/100ML-% IV SOLN
INTRAVENOUS | Status: AC
  Administered 2021-02-10: 2 g via INTRAVENOUS
  Filled 2021-02-10: qty 100

## 2021-02-10 SURGICAL SUPPLY — 62 items
BLADE SAGITTAL AGGR TOOTH XLG (BLADE) ×2 IMPLANT
BNDG COHESIVE 6X5 TAN ST LF (GAUZE/BANDAGES/DRESSINGS) ×6 IMPLANT
CANISTER WOUND CARE 500ML ATS (WOUND CARE) ×2 IMPLANT
CHLORAPREP W/TINT 26 (MISCELLANEOUS) ×2 IMPLANT
COVER BACK TABLE REUSABLE LG (DRAPES) ×2 IMPLANT
DRAPE 3/4 80X56 (DRAPES) ×6 IMPLANT
DRAPE C-ARM XRAY 36X54 (DRAPES) ×2 IMPLANT
DRAPE INCISE IOBAN 66X60 STRL (DRAPES) IMPLANT
DRAPE POUCH INSTRU U-SHP 10X18 (DRAPES) ×2 IMPLANT
DRESSING SURGICEL FIBRLLR 1X2 (HEMOSTASIS) ×2 IMPLANT
DRSG MEPILEX SACRM 8.7X9.8 (GAUZE/BANDAGES/DRESSINGS) ×2 IMPLANT
DRSG OPSITE POSTOP 4X8 (GAUZE/BANDAGES/DRESSINGS) ×4 IMPLANT
DRSG SURGICEL FIBRILLAR 1X2 (HEMOSTASIS) ×4
ELECT BLADE 6.5 EXT (BLADE) ×2 IMPLANT
ELECT REM PT RETURN 9FT ADLT (ELECTROSURGICAL) ×2
ELECTRODE REM PT RTRN 9FT ADLT (ELECTROSURGICAL) ×1 IMPLANT
GAUZE 4X4 16PLY ~~LOC~~+RFID DBL (SPONGE) ×2 IMPLANT
GLOVE SURG SYN 9.0  PF PI (GLOVE) ×2
GLOVE SURG SYN 9.0 PF PI (GLOVE) ×2 IMPLANT
GLOVE SURG UNDER POLY LF SZ9 (GLOVE) ×2 IMPLANT
GOWN SRG 2XL LVL 4 RGLN SLV (GOWNS) ×1 IMPLANT
GOWN STRL NON-REIN 2XL LVL4 (GOWNS) ×1
GOWN STRL REUS W/ TWL LRG LVL3 (GOWN DISPOSABLE) ×1 IMPLANT
GOWN STRL REUS W/TWL LRG LVL3 (GOWN DISPOSABLE) ×1
HEAD FEMORAL 28MM SZ S (Head) ×1 IMPLANT
HEMOVAC 400CC 10FR (MISCELLANEOUS) IMPLANT
HIP DBL LINER 54X28 (Liner) ×1 IMPLANT
HOLDER FOLEY CATH W/STRAP (MISCELLANEOUS) ×2 IMPLANT
KIT PREVENA INCISION MGT 13 (CANNISTER) ×2 IMPLANT
MANIFOLD NEPTUNE II (INSTRUMENTS) ×2 IMPLANT
MAT ABSORB  FLUID 56X50 GRAY (MISCELLANEOUS) ×1
MAT ABSORB FLUID 56X50 GRAY (MISCELLANEOUS) ×1 IMPLANT
NDL SAFETY ECLIPSE 18X1.5 (NEEDLE) ×1 IMPLANT
NDL SPNL 20GX3.5 QUINCKE YW (NEEDLE) ×2 IMPLANT
NEEDLE HYPO 18GX1.5 SHARP (NEEDLE)
NEEDLE SPNL 20GX3.5 QUINCKE YW (NEEDLE) ×4 IMPLANT
NS IRRIG 1000ML POUR BTL (IV SOLUTION) ×2 IMPLANT
PACK HIP COMPR (MISCELLANEOUS) ×2 IMPLANT
SCALPEL PROTECTED #10 DISP (BLADE) ×4 IMPLANT
SHELL ACETABULAR SZ 54 DM (Shell) ×1 IMPLANT
SOL PREP PVP 2OZ (MISCELLANEOUS) ×2
SOLUTION PREP PVP 2OZ (MISCELLANEOUS) ×1 IMPLANT
SOLUTION PRONTOSAN WOUND 350ML (IRRIGATION / IRRIGATOR) IMPLANT
SPONGE DRAIN TRACH 4X4 STRL 2S (GAUZE/BANDAGES/DRESSINGS) ×2 IMPLANT
SPONGE T-LAP 18X18 ~~LOC~~+RFID (SPONGE) ×4 IMPLANT
STAPLER SKIN PROX 35W (STAPLE) ×2 IMPLANT
STEM FEMORAL SZ3  STD COLLARED (Stem) ×1 IMPLANT
STRAP SAFETY 5IN WIDE (MISCELLANEOUS) ×2 IMPLANT
SUT DVC 2 QUILL PDO  T11 36X36 (SUTURE) ×1
SUT DVC 2 QUILL PDO T11 36X36 (SUTURE) ×1 IMPLANT
SUT SILK 0 (SUTURE) ×1
SUT SILK 0 30XBRD TIE 6 (SUTURE) ×1 IMPLANT
SUT V-LOC 90 ABS DVC 3-0 CL (SUTURE) ×2 IMPLANT
SUT VIC AB 1 CT1 36 (SUTURE) ×2 IMPLANT
SYR 20ML LL LF (SYRINGE) ×2 IMPLANT
SYR 30ML LL (SYRINGE) ×2 IMPLANT
SYR 50ML LL SCALE MARK (SYRINGE) ×4 IMPLANT
SYR BULB IRRIG 60ML STRL (SYRINGE) ×2 IMPLANT
TAPE MICROFOAM 4IN (TAPE) ×2 IMPLANT
TOWEL OR 17X26 4PK STRL BLUE (TOWEL DISPOSABLE) ×2 IMPLANT
TRAY FOLEY MTR SLVR 16FR STAT (SET/KITS/TRAYS/PACK) ×2 IMPLANT
WATER STERILE IRR 500ML POUR (IV SOLUTION) ×2 IMPLANT

## 2021-02-10 NOTE — Op Note (Signed)
02/10/2021  11:09 AM  PATIENT:  Tara Carlson  45 y.o. female  PRE-OPERATIVE DIAGNOSIS:  Primary osteoarthritis of left hip  M16.12  POST-OPERATIVE DIAGNOSIS:  Primary osteoarthritis of left hip  M16.12  PROCEDURE:  Procedure(s): TOTAL HIP ARTHROPLASTY ANTERIOR APPROACH (Left)  SURGEON: Leitha Schuller, MD  ASSISTANTS: None  ANESTHESIA:   spinal  EBL:  Total I/O In: 1100 [I.V.:1000; IV Piggyback:100] Out: 950 [Urine:800; Blood:150]  BLOOD ADMINISTERED:none  DRAINS:  Incisional wound VAC    LOCAL MEDICATIONS USED:  MARCAINE     SPECIMEN: Left femoral head  DISPOSITION OF SPECIMEN:  PATHOLOGY  COUNTS:  YES  TOURNIQUET:  * No tourniquets in log *  IMPLANTS: Medacta AMIS 3 standard stem, 54 mm Mpact DM cup with liner and ceramic S 28 mm head  DICTATION: .Dragon Dictation   The patient was brought to the operating room and after spinal anesthesia was obtained patient was placed on the operative table with the ipsilateral foot into the Medacta attachment, contralateral leg on a well-padded table. C-arm was brought in and preop template x-ray taken. After prepping and draping in usual sterile fashion appropriate patient identification and timeout procedures were completed. Anterior approach to the hip was obtained and centered over the greater trochanter and TFL muscle. The subcutaneous tissue was incised hemostasis being achieved by electrocautery. TFL fascia was incised and the muscle retracted laterally deep retractor placed. The lateral femoral circumflex vessels were identified and ligated. The anterior capsule was exposed and a capsulotomy performed. The neck was identified and a femoral neck cut carried out with a saw. The head was removed without difficulty and showed sclerotic femoral head and acetabulum. Reaming was carried out to 54 mm and a 54 mm cup trial gave appropriate tightness to the acetabular component a 54 DM cup was impacted into position. The leg was then  externally rotated and ischiofemoral and pubofemoral releases carried out. The femur was sequentially broached to a size 3, size 3 standard stem with S head trials were placed and the final components chosen. The 3 standard stem was inserted along with a ceramic S 28 mm head and 54 mm liner. The hip was reduced and was stable the wound was thoroughly irrigated with fibrillar placed along the posterior capsule and medial neck. The deep fascia ws closed using a heavy Quill after infiltration of 30 cc of quarter percent Sensorcaine with epinephrine.3-0 V-loc to close the skin with skin staples.  Incisional wound VAC applied and patient was sent to recovery in stable condition.   PLAN OF CARE: Admit for overnight observation
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
## 2021-02-10 NOTE — Anesthesia Procedure Notes (Signed)
Spinal  Patient location during procedure: OR Start time: 02/10/2021 9:40 AM End time: 02/10/2021 9:43 AM Reason for block: surgical anesthesia Staffing Performed: resident/CRNA  Resident/CRNA: Junious Silk, CRNA Preanesthetic Checklist Completed: patient identified, IV checked, site marked, risks and benefits discussed, surgical consent, monitors and equipment checked, pre-op evaluation and timeout performed Spinal Block Patient position: sitting Prep: DuraPrep Patient monitoring: heart rate, cardiac monitor, continuous pulse ox and blood pressure Approach: midline Location: L3-4 Injection technique: single-shot Needle Needle type: Whitacre  Needle gauge: 25 G Needle length: 9 cm Assessment Sensory level: T10 Events: CSF return Additional Notes Pt tolerated well  Single attempt

## 2021-02-10 NOTE — Transfer of Care (Signed)
Immediate Anesthesia Transfer of Care Note  Patient: Tara Carlson  Procedure(s) Performed: TOTAL HIP ARTHROPLASTY ANTERIOR APPROACH (Left: Hip)  Patient Location: PACU  Anesthesia Type:Spinal  Level of Consciousness: awake, drowsy and patient cooperative  Airway & Oxygen Therapy: Patient Spontanous Breathing  Post-op Assessment: Report given to RN and Post -op Vital signs reviewed and stable  Post vital signs: Reviewed and stable  Last Vitals:  Vitals Value Taken Time  BP 107/60   Temp    Pulse 105 02/10/21 1112  Resp 18 02/10/21 1112  SpO2 100 % 02/10/21 1112  Vitals shown include unvalidated device data.  Last Pain:  Vitals:   02/10/21 0817  TempSrc: Temporal  PainSc: 5          Complications: No notable events documented.

## 2021-02-10 NOTE — H&P (Signed)
Chief Complaint  Patient presents with   Left Hip - Follow-up  Lt THA 02/10/21    History of the Present Illness: Tara Carlson is a 45 y.o. female here today for history and physical for left total hip arthroplasty with Dr. Kennedy Bucker on 02/10/2021. Patient has severe left hip osteoarthritis with complete loss of central joint space, severe sclerotic changes and subchondral cyst formation. There is deformity to the femoral head. Pain is severe. She recently underwent cortisone injection with Dr. Landry Mellow with no relief. She has a history of successful right total hip arthroplasty. Her left hip pain is severe located in her groin down the lateral aspect of the leg to the knee. Patient walks but she has a limp at all times. Patient has tried conservative treatment with no relief.  The patient is employed as a Conservation officer, nature at [employer].  I have reviewed past medical, surgical, social and family history, and allergies as documented in the EMR.  Past Medical History: Past Medical History:  Diagnosis Date   Anemia   Arthritis   Seizures (CMS-HCC)   Past Surgical History: Past Surgical History:  Procedure Laterality Date   ARTHROPLASTY HIP TOTAL Right 10/29/2018   CESAREAN SECTION   DILATION AND CURETTAGE OF UTERUS   ENDOMETRIAL ABLATION   HYSTEROSCOPY   Past Family History: Family History  Problem Relation Age of Onset   Cancer Mother   Myocardial Infarction (Heart attack) Mother   Medications: Current Outpatient Medications Ordered in Epic  Medication Sig Dispense Refill   acetaminophen (TYLENOL ORAL) Take by mouth as needed.   baclofen (LIORESAL) 10 MG tablet Take 1 tablet (10 mg total) by mouth 3 (three) times daily 270 tablet 1   celecoxib (CELEBREX) 100 MG capsule Take 1 capsule by mouth once daily   cholecalciferol (VITAMIN D3) 1,000 unit capsule Take 1,000 Units by mouth once daily.   ferrous sulfate (IRON ORAL) Take by mouth.   folic acid (FOLVITE)  800 MCG tablet Take 400 mcg by mouth once daily.   levETIRAcetam (KEPPRA) 750 MG tablet Take 1 tablet (750 mg total) by mouth 2 (two) times daily for 90 days 180 tablet 3   magnesium oxide (MAG-OX) 400 mg tablet Take 400 mg by mouth once daily.   methocarbamoL (ROBAXIN) 750 MG tablet Take 1 tablet (750 mg total) by mouth 3 (three) times daily 90 tablet 1   multivitamin tablet Take 1 tablet by mouth once daily   phenytoin (DILANTIN) 100 MG ER capsule 2 PO in AM, 3 PO in PM 450 capsule 3   No current Epic-ordered facility-administered medications on file.   Allergies: No Known Allergies   Body mass index is 28.76 kg/m.  Review of Systems: A comprehensive 14 point ROS was performed, reviewed, and the pertinent orthopaedic findings are documented in the HPI.  Vitals:  02/04/21 1554  BP: 126/82    General Physical Examination:   General:  Well developed, well nourished, no apparent distress, normal affect, normal gait with antalgic gait  HEENT: Head normocephalic, atraumatic, PERRL.   Abdomen: Soft, non tender, non distended, Bowel sounds present.  Heart: Examination of the heart reveals regular, rate, and rhythm. There is no murmur noted on ascultation. There is a normal apical pulse.  Lungs: Lungs are clear to auscultation. There is no wheeze, rhonchi, or crackles. There is normal expansion of bilateral chest walls.   Left lower extremity musculoskeletal Examination: The left hip has internal rotation to 5 degrees, external to 10  degrees. Antalgic gait. She has good rotator cuff strength of the left. She has full passive motion of the left shoulder.   Radiographs:  AP pelvis and lateral x-rays of the left hip were ordered and personally reviewed today from 12/15/2020. These show complete loss of medial joint space, extensive subchondral sclerosis in the acetabulum, large subchondral cysts in the femoral head, as well as osteophytes around the femoral head. Lateral view shows  subchondral cyst in the acetabulum, as well as large osteophytes. Comparison to last x-ray with AP pelvis of the right hip from 2020 shows left hip is quite a bit worse.  X-ray Impression Severe arthritis, left hip.  Assessment: ICD-10-CM  1. Primary osteoarthritis of left hip M16.12   Plan:  8. 45 year old female with severe left hip osteoarthritis. Pain severe and interfering with quality life and activities day living. Risks, benefits, complications of a left total hip arthroplasty have discussed with patient. Patient has agreed and consented procedure with Dr. Kennedy Bucker on 02/10/2021.  Electronically signed by Patience Musca, PA at 02/04/2021 4:26 PM EST  Reviewed  H+P. No changes noted.

## 2021-02-11 ENCOUNTER — Encounter: Payer: Self-pay | Admitting: Orthopedic Surgery

## 2021-02-11 DIAGNOSIS — M1612 Unilateral primary osteoarthritis, left hip: Secondary | ICD-10-CM | POA: Diagnosis not present

## 2021-02-11 LAB — CBC
HCT: 28.1 % — ABNORMAL LOW (ref 36.0–46.0)
Hemoglobin: 9.6 g/dL — ABNORMAL LOW (ref 12.0–15.0)
MCH: 31.3 pg (ref 26.0–34.0)
MCHC: 34.2 g/dL (ref 30.0–36.0)
MCV: 91.5 fL (ref 80.0–100.0)
Platelets: 137 10*3/uL — ABNORMAL LOW (ref 150–400)
RBC: 3.07 MIL/uL — ABNORMAL LOW (ref 3.87–5.11)
RDW: 11.7 % (ref 11.5–15.5)
WBC: 5.9 10*3/uL (ref 4.0–10.5)
nRBC: 0 % (ref 0.0–0.2)

## 2021-02-11 LAB — BASIC METABOLIC PANEL
Anion gap: 4 — ABNORMAL LOW (ref 5–15)
BUN: 8 mg/dL (ref 6–20)
CO2: 28 mmol/L (ref 22–32)
Calcium: 8.1 mg/dL — ABNORMAL LOW (ref 8.9–10.3)
Chloride: 99 mmol/L (ref 98–111)
Creatinine, Ser: 0.49 mg/dL (ref 0.44–1.00)
GFR, Estimated: >60 mL/min (ref >60)
Glucose, Bld: 117 mg/dL — ABNORMAL HIGH (ref 70–99)
Potassium: 4 mmol/L (ref 3.5–5.1)
Sodium: 131 mmol/L — ABNORMAL LOW (ref 135–145)

## 2021-02-11 LAB — SURGICAL PATHOLOGY

## 2021-02-11 MED ORDER — CHLORHEXIDINE GLUCONATE CLOTH 2 % EX PADS: 6.0000 | MEDICATED_PAD | Freq: Every day | CUTANEOUS | Status: DC

## 2021-02-11 MED ORDER — DOCUSATE SODIUM 100 MG PO CAPS: 100.0000 mg | ORAL_CAPSULE | Freq: Two times a day (BID) | ORAL | 0 refills | Status: AC

## 2021-02-11 MED ORDER — OXYCODONE HCL 5 MG PO TABS
5.0000 mg | ORAL_TABLET | ORAL | Status: DC | PRN
Start: 2021-02-11 — End: 2021-02-12
  Administered 2021-02-11 – 2021-02-12 (×6): 10 mg via ORAL
  Filled 2021-02-11 (×6): qty 2

## 2021-02-11 MED ORDER — OXYCODONE HCL 5 MG PO TABS
5.0000 mg | ORAL_TABLET | ORAL | 0 refills | Status: AC | PRN
Start: 2021-02-11 — End: ?

## 2021-02-11 MED ORDER — ENOXAPARIN SODIUM 40 MG/0.4ML IJ SOSY: 40.0000 mg | PREFILLED_SYRINGE | INTRAMUSCULAR | 0 refills | Status: AC

## 2021-02-11 MED ORDER — POLYETHYLENE GLYCOL 3350 17 G PO PACK: 17.0000 g | PACK | Freq: Every day | ORAL | 0 refills | Status: AC | PRN

## 2021-02-11 MED ORDER — ACETAMINOPHEN 325 MG PO TABS
325.0000 mg | ORAL_TABLET | Freq: Four times a day (QID) | ORAL | Status: AC | PRN
Start: 2021-02-11 — End: ?

## 2021-02-11 NOTE — Discharge Instructions (Signed)
°ANTERIOR APPROACH TOTAL HIP REPLACEMENT POSTOPERATIVE DIRECTIONS ° ° °Hip Rehabilitation, Guidelines Following Surgery  °The results of a hip operation are greatly improved after range of motion and muscle strengthening exercises. Follow all safety measures which are given to protect your hip. If any of these exercises cause increased pain or swelling in your joint, decrease the amount until you are comfortable again. Then slowly increase the exercises. Call your caregiver if you have problems or questions.  ° °HOME CARE INSTRUCTIONS  °Remove items at home which could result in a fall. This includes throw rugs or furniture in walking pathways.  °ICE to the affected hip every three hours for 30 minutes at a time and then as needed for pain and swelling.  Continue to use ice on the hip for pain and swelling from surgery. You may notice swelling that will progress down to the foot and ankle.  This is normal after surgery.  Elevate the leg when you are not up walking on it.   °Continue to use the breathing machine which will help keep your temperature down.  It is common for your temperature to cycle up and down following surgery, especially at night when you are not up moving around and exerting yourself.  The breathing machine keeps your lungs expanded and your temperature down. °Do not place pillow under knee, focus on keeping the knee straight while resting ° °DIET °You may resume your previous home diet once your are discharged from the hospital. ° °DRESSING / WOUND CARE / SHOWERING °Please remove provena negative pressure dressing on 02/18/2021 and apply honey comb dressing. Keep dressing clean and dry at all times.  ° °ACTIVITY °Walk with your walker as instructed. °Use walker as long as suggested by your caregivers. °Avoid periods of inactivity such as sitting longer than an hour when not asleep. This helps prevent blood clots.  °You may resume a sexual relationship in one month or when given the OK by your  doctor.  °You may return to work once you are cleared by your doctor.  °Do not drive a car for 6 weeks or until released by you surgeon.  °Do not drive while taking narcotics. ° °WEIGHT BEARING °Weight bearing as tolerated. Use walker/cane as needed for at least 4 weeks post op. ° °POSTOPERATIVE CONSTIPATION PROTOCOL °Constipation - defined medically as fewer than three stools per week and severe constipation as less than one stool per week. ° °One of the most common issues patients have following surgery is constipation.  Even if you have a regular bowel pattern at home, your normal regimen is likely to be disrupted due to multiple reasons following surgery.  Combination of anesthesia, postoperative narcotics, change in appetite and fluid intake all can affect your bowels.  In order to avoid complications following surgery, here are some recommendations in order to help you during your recovery period. ° °Colace (docusate) - Pick up an over-the-counter form of Colace or another stool softener and take twice a day as long as you are requiring postoperative pain medications.  Take with a full glass of water daily.  If you experience loose stools or diarrhea, hold the colace until you stool forms back up.  If your symptoms do not get better within 1 week or if they get worse, check with your doctor. ° °Dulcolax (bisacodyl) - Pick up over-the-counter and take as directed by the product packaging as needed to assist with the movement of your bowels.  Take with a full glass of   water.  Use this product as needed if not relieved by Colace only.  ° °MiraLax (polyethylene glycol) - Pick up over-the-counter to have on hand.  MiraLax is a solution that will increase the amount of water in your bowels to assist with bowel movements.  Take as directed and can mix with a glass of water, juice, soda, coffee, or tea.  Take if you go more than two days without a movement. °Do not use MiraLax more than once per day. Call your doctor  if you are still constipated or irregular after using this medication for 7 days in a row. ° °If you continue to have problems with postoperative constipation, please contact the office for further assistance and recommendations.  If you experience "the worst abdominal pain ever" or develop nausea or vomiting, please contact the office immediatly for further recommendations for treatment. ° °ITCHING ° If you experience itching with your medications, try taking only a single pain pill, or even half a pain pill at a time.  You can also use Benadryl over the counter for itching or also to help with sleep.  ° °TED HOSE STOCKINGS °Wear the elastic stockings on both legs for six weeks following surgery during the day but you may remove then at night for sleeping. ° °MEDICATIONS °See your medication summary on the “After Visit Summary” that the nursing staff will review with you prior to discharge.  You may have some home medications which will be placed on hold until you complete the course of blood thinner medication.  It is important for you to complete the blood thinner medication as prescribed by your surgeon.  Continue your approved medications as instructed at time of discharge. ° °PRECAUTIONS °If you experience chest pain or shortness of breath - call 911 immediately for transfer to the hospital emergency department.  °If you develop a fever greater that 101 F, purulent drainage from wound, increased redness or drainage from wound, foul odor from the wound/dressing, or calf pain - CONTACT YOUR SURGEON.   °                                                °FOLLOW-UP APPOINTMENTS °Make sure you keep all of your appointments after your operation with your surgeon and caregivers. You should call the office at the above phone number and make an appointment for approximately two weeks after the date of your surgery or on the date instructed by your surgeon outlined in the "After Visit Summary". ° °RANGE OF MOTION AND  STRENGTHENING EXERCISES  °These exercises are designed to help you keep full movement of your hip joint. Follow your caregiver's or physical therapist's instructions. Perform all exercises about fifteen times, three times per day or as directed. Exercise both hips, even if you have had only one joint replacement. These exercises can be done on a training (exercise) mat, on the floor, on a table or on a bed. Use whatever works the best and is most comfortable for you. Use music or television while you are exercising so that the exercises are a pleasant break in your day. This will make your life better with the exercises acting as a break in routine you can look forward to.  °Lying on your back, slowly slide your foot toward your buttocks, raising your knee up off the floor. Then slowly slide your   foot back down until your leg is straight again.  °Lying on your back spread your legs as far apart as you can without causing discomfort.  °Lying on your side, raise your upper leg and foot straight up from the floor as far as is comfortable. Slowly lower the leg and repeat.  °Lying on your back, tighten up the muscle in the front of your thigh (quadriceps muscles). You can do this by keeping your leg straight and trying to raise your heel off the floor. This helps strengthen the largest muscle supporting your knee.  °Lying on your back, tighten up the muscles of your buttocks both with the legs straight and with the knee bent at a comfortable angle while keeping your heel on the floor.  ° °IF YOU ARE TRANSFERRED TO A SKILLED REHAB FACILITY °If the patient is transferred to a skilled rehab facility following release from the hospital, a list of the current medications will be sent to the facility for the patient to continue.  When discharged from the skilled rehab facility, please have the facility set up the patient's Home Health Physical Therapy prior to being released. Also, the skilled facility will be responsible for  providing the patient with their medications at time of release from the facility to include their pain medication, the muscle relaxants, and their blood thinner medication. If the patient is still at the rehab facility at time of the two week follow up appointment, the skilled rehab facility will also need to assist the patient in arranging follow up appointment in our office and any transportation needs. ° °MAKE SURE YOU:  °Understand these instructions.  °Get help right away if you are not doing well or get worse.  ° ° °Pick up stool softner and laxative for home use following surgery while on pain medications. °Continue to use ice for pain and swelling after surgery. °Do not use any lotions or creams on the incision until instructed by your surgeon. ° °
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
## 2021-02-11 NOTE — Anesthesia Postprocedure Evaluation (Signed)
Anesthesia Post Note  Patient: Tara Carlson  Procedure(s) Performed: TOTAL HIP ARTHROPLASTY ANTERIOR APPROACH (Left: Hip)  Patient location during evaluation: Nursing Unit Anesthesia Type: Spinal Level of consciousness: awake, oriented and awake and alert Pain management: pain level controlled Vital Signs Assessment: post-procedure vital signs reviewed and stable Respiratory status: spontaneous breathing, respiratory function stable and nonlabored ventilation Cardiovascular status: blood pressure returned to baseline and stable Postop Assessment: no headache and no backache Anesthetic complications: no   No notable events documented.   Last Vitals:  Vitals:   02/11/21 0338 02/11/21 0837  BP: 118/65 119/89  Pulse: 100 100  Resp: 16 14  Temp: 37.2 C 37.3 C  SpO2:  100%    Last Pain:  Vitals:   02/11/21 1019  TempSrc:   PainSc: 10-Worst pain ever                 [employer]
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
## 2021-02-11 NOTE — Progress Notes (Signed)
Vitals taken by Salam Chesterfield °

## 2021-02-11 NOTE — TOC Progression Note (Signed)
Transition of Care [facility]) - Progression Note    Patient Details  Name: Tara Carlson MRN: [id number] Date of Birth: 1976/01/31  [facility] of [facility] CM/SW [place], RN Phone Number: 02/11/2021, 3:23 PM  Clinical Narrative:   Met with the patient and her mother and father in the room at the bedside, She has a rw and a 3 in 1 at home from a previous hip surgery for the other side.  She will not get Home health having [place], She is agreeable to go to Outpatient PT, I notified Dr Rudene Christians and Rachelle Hora that she will need to be set up with Outpatient PT, She has transportation and can afford her medication, no additional needs         Expected Discharge Plan and Services                                                 Social Determinants of Health (SDOH) Interventions    Readmission Risk Interventions No flowsheet data found.

## 2021-02-11 NOTE — Evaluation (Signed)
Occupational Therapy Evaluation Patient Details Name: Tara Carlson MRN: [id number] DOB: 12/01/1975 Today's Date: 02/11/2021   History of Present Illness The pt presents s/p L THA. PMHx is significant for anemia, arthritis, and seizures. She reports that she had a R THA a few years ago and therefore, is understanding of the process.   Clinical Impression   Pt seen for OT evaluation this date, POD#1 from above surgery. Pt was independent in all ADL and IADL prior to surgery, increasingly limited due to L hip pain. Pt is eager to return to PLOF with less pain and improved safety and independence. Pt currently requires PRN MIN assist for LB dressing and bathing while in seated position due to pain and limited AROM of L hip. Pt/spouse instructed in self care skills, falls prevention strategies, home/routines modifications, pet care considerations, DME/AE for LB bathing and dressing tasks, and compression stocking mgt strategies. Handout provided to support recall and carryover. Pt would benefit from additional instruction in self care skills and techniques to help maintain precautions with or without assistive devices to support recall and carryover prior to discharge. Recommend HHOT upon discharge.     Recommendations for follow up therapy are one component of a multi-disciplinary discharge planning process, led by the attending physician.  Recommendations may be updated based on patient status, additional functional criteria and insurance authorization.   Follow Up Recommendations  Home health OT    Assistance Recommended at Discharge PRN  Functional Status Assessment  Patient has had a recent decline in their functional status and demonstrates the ability to make significant improvements in function in a reasonable and predictable amount of time.  Equipment Recommendations  None recommended by OT    Recommendations for Other Services       Precautions / Restrictions  Precautions Precautions: Anterior Hip;Fall Precaution Booklet Issued: Yes (comment) Restrictions Weight Bearing Restrictions: Yes LLE Weight Bearing: Weight bearing as tolerated      Mobility Bed Mobility      General bed mobility comments: deferred 2/2 pain and recently OOB    Transfers          General transfer comment: deferred 2/2 pain and recently OOB and now groggy      Balance Overall balance assessment: Needs assistance Sitting-balance support: Feet supported Sitting balance-Leahy Scale: Fair Sitting balance - Comments: pain limiting balance at this time.   Standing balance support: During functional activity Standing balance-Leahy Scale: Fair                             ADL either performed or assessed with clinical judgement   ADL                                         General ADL Comments: Pt currently requires PRN MIN A for LB ADL primarily limited by L hip pain wiht hip flexion, spouse able to provide needed level of assist     Vision         Perception     Praxis      Pertinent Vitals/Pain Pain Assessment: 0-10 Pain Score: 5  Pain Location: L hip Pain Descriptors / Indicators: Aching Pain Intervention(s): Limited activity within patient's tolerance;Monitored during session;Premedicated before session;Ice applied     Hand Dominance Right   Extremity/Trunk Assessment Upper Extremity Assessment Upper Extremity Assessment: Overall WFL for  tasks assessed   Lower Extremity Assessment Lower Extremity Assessment: LLE deficits/detail LLE Deficits / Details: post-op strength/ROM deficits LLE: Unable to fully assess due to pain       Communication Communication Communication: No difficulties   Cognition Arousal/Alertness: Awake/alert;Suspect due to medications Behavior During Therapy: [facility] for tasks assessed/performed Overall Cognitive Status: Within Functional Limits for tasks assessed                                  General Comments: pt becoming very groggy     General Comments       Exercises Other Exercises Other Exercises: Pt/spouse instructed in home/routines modifications, falls prevention, pet care considerations, AE/DME for ADL tasks, and compressions tocking mgt   Shoulder Instructions      Home Living Family/patient expects to be discharged to:: Private residence Living Arrangements: Spouse/significant other;Children Available Help at Discharge: Family;Friend(s);Available 24 hours/day Type of Home: House Home Access: Stairs to enter [employer] of Steps: 4 Entrance Stairs-Rails: Right Home Layout: One level     Bathroom Shower/Tub: Producer, television/film/video: Handicapped height     Home Equipment: Agricultural consultant (2 wheels);Rollator (4 wheels);Cane - single point;BSC/3in1;Grab bars - tub/shower;Grab bars - toilet;Tub bench          Prior Functioning/Environment Prior Level of Function : Independent/Modified Independent;Working/employed;Driving               ADLs Comments: works as a Conservation officer, 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        OT Problem List: Decreased strength;Pain;Decreased range of motion;Decreased knowledge of use of DME or AE      OT Treatment/Interventions: Self-care/ADL training;Therapeutic exercise;Therapeutic activities;DME and/or AE instruction;Patient/family education;Balance training    OT Goals(Current goals can be found in the care plan section) Acute Rehab OT Goals Patient Stated Goal: go home OT Goal Formulation: With patient/family Time For Goal Achievement: 02/25/21 Potential to Achieve Goals: Good ADL Goals Pt Will Perform Lower Body Dressing: sit to/from stand;with modified independence;with caregiver independent in assisting;with adaptive equipment Pt Will Transfer to Toilet: with modified independence;ambulating Additional ADL Goal #1: Pt will independently instruct family in compression  stocking mgt  OT Frequency: Min 2X/week   Barriers to D/C:            Co-evaluation              AM-PAC OT "6 Clicks" Daily Activity     Outcome Measure Help from another person eating meals?: None Help from another person taking care of personal grooming?: None Help from another person toileting, which includes using toliet, bedpan, or urinal?: A Little Help from another person bathing (including washing, rinsing, drying)?: A Little Help from another person to put on and taking off regular upper body clothing?: None Help from another person to put on and taking off regular lower body clothing?: A Little 6 Click Score: 21   End of Session    Activity Tolerance: Patient tolerated treatment well Patient left: in bed;with call bell/phone within reach;with bed alarm set;with family/visitor present  OT Visit Diagnosis: Other abnormalities of gait and mobility (R26.89);Pain Pain - Right/Left: Left Pain - part of body: Hip                Time: 1050-1102 OT Time Calculation (min): 12 min Charges:  OT General Charges $OT Visit: 1 Visit OT Evaluation $OT Eval Low Complexity: 1 Low Arman Filter., MPH, MS, OTR/L  ascom [phone] 02/11/21, 1:26 PM

## 2021-02-11 NOTE — Plan of Care (Signed)

## 2021-02-12 DIAGNOSIS — M1612 Unilateral primary osteoarthritis, left hip: Secondary | ICD-10-CM | POA: Diagnosis not present

## 2021-02-12 LAB — CBC
HCT: 27.5 % — ABNORMAL LOW (ref 36.0–46.0)
Hemoglobin: 9.4 g/dL — ABNORMAL LOW (ref 12.0–15.0)
MCH: 31.3 pg (ref 26.0–34.0)
MCHC: 34.2 g/dL (ref 30.0–36.0)
MCV: 91.7 fL (ref 80.0–100.0)
Platelets: 130 10*3/uL — ABNORMAL LOW (ref 150–400)
RBC: 3 MIL/uL — ABNORMAL LOW (ref 3.87–5.11)
RDW: 11.7 % (ref 11.5–15.5)
WBC: 5.1 10*3/uL (ref 4.0–10.5)
nRBC: 0 % (ref 0.0–0.2)

## 2021-02-12 MED ORDER — METHOCARBAMOL 500 MG PO TABS: 500.0000 mg | ORAL_TABLET | Freq: Four times a day (QID) | ORAL | 0 refills | Status: AC | PRN
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
## 2021-02-12 NOTE — Progress Notes (Addendum)
Nsg Discharge Note  Admit Date:  02/10/2021 Discharge date: 02/12/2021   Joyice Faster to be D/C'd Home per MD order.  AVS completed.  Patient/caregiver able to verbalize understanding.  Discharge Medication: Allergies as of 02/12/2021   No Known Allergies      Medication List     STOP taking these medications    HYDROcodone-acetaminophen 5-325 MG tablet Commonly known as: NORCO/VICODIN       TAKE these medications    acetaminophen 325 MG tablet Commonly known as: TYLENOL Take 1-2 tablets (325-650 mg total) by mouth every 6 (six) hours as needed for mild pain (pain score 1-3 or temp > 100.5).   Biofreeze 4 % Gel Generic drug: Menthol (Topical Analgesic) Apply 1 application topically daily as needed (pain).   docusate sodium 100 MG capsule Commonly known as: COLACE Take 1 capsule (100 mg total) by mouth 2 (two) times daily.   enoxaparin 40 MG/0.4ML injection Commonly known as: LOVENOX Inject 0.4 mLs (40 mg total) into the skin daily for 14 days.   folic acid 400 MCG tablet Commonly known as: FOLVITE Take 400 mcg by mouth daily.   levETIRAcetam 750 MG tablet Commonly known as: KEPPRA Take 750 mg by mouth 2 (two) times daily.   magnesium oxide 400 MG tablet Commonly known as: MAG-OX Take 400 mg by mouth 2 (two) times daily.   methocarbamol 500 MG tablet Commonly known as: ROBAXIN Take 1 tablet (500 mg total) by mouth every 6 (six) hours as needed for muscle spasms. What changed:  medication strength how much to take when to take this   multivitamin with minerals tablet Take 1 tablet by mouth 2 (two) times daily.   oxyCODONE 5 MG immediate release tablet Commonly known as: Oxy IR/ROXICODONE Take 1-2 tablets (5-10 mg total) by mouth every 4 (four) hours as needed for severe pain or moderate pain.   phenytoin 100 MG ER capsule Commonly known as: DILANTIN take 2 capsules by mouth every morning AND 3 CAPSULES EVERY EVENING What changed: See the new  instructions.   polyethylene glycol 17 g packet Commonly known as: MIRALAX / GLYCOLAX Take 17 g by mouth daily as needed for mild constipation.   Vitamin D 50 MCG (2000 UT) Caps Take 2,000 Units by mouth daily.               Durable Medical Equipment  (From admission, onward)           Start     Ordered   02/10/21 1900  DME Walker rolling  Once       Question Answer Comment  Walker: With 5 Inch Wheels   Patient needs a walker to treat with the following condition Status post total hip replacement, left      02/10/21 1900   02/10/21 1900  DME 3 n 1  Once        02/10/21 1900   02/10/21 1900  DME Bedside commode  Once       Question:  Patient needs a bedside commode to treat with the following condition  Answer:  Status post total hip replacement, left   02/10/21 1900            Discharge Assessment: Vitals:   02/12/21 0748 02/12/21 1124  BP: (!) 109/57 120/65  Pulse: 99 100  Resp: 18 18  Temp: 99.5 F (37.5 C) 98.2 F (36.8 C)  SpO2: 98% 100%   Skin clean, dry and intact without evidence of skin break  down, no evidence of skin tears noted.Patient educated on home wound vac IV catheter discontinued intact. Site without signs and symptoms of complications - no redness or edema noted at insertion site, patient denies c/o pain - only slight tenderness at site.  Dressing with slight pressure applied.  D/c Instructions-Education: Discharge instructions given to patient/family with verbalized understanding. D/c education completed with patient/family including follow up instructions, medication list, d/c activities limitations if indicated, with other d/c instructions as indicated by MD - patient able to verbalize understanding, all questions fully answered. Patient instructed to return to ED, call 911, or call MD for any changes in condition.  Patient escorted via WC, and D/C home via private auto.  Benford Asch, Tilford Pillar, RN 02/12/2021 1:16 PM

## 2021-02-12 NOTE — Progress Notes (Signed)
Physical Therapy Treatment Patient Details Name: Tara Carlson MRN: [id number] DOB: 08-04-1975 Today's Date: 02/12/2021   History of Present Illness The pt presents s/p L THA. PMHx is significant for anemia, arthritis, and seizures. She reports that she had a R THA a few years ago and therefore, is understanding of the process.    PT Comments    Pt was long sitting in bed with supportive spouse at bedside. Agrees to session and is cooperative and motivated throughout. Does require a little assistance to exit bed however once seated EOB only required supervision to stand, and ambulate with RW. Performed stairs safely without assistance. Pt is cleared form an acute PT standpoint for safe DC home with OP PT to follow. She will continue to benefit from OP PT to progress to PLOF while improving independence with all ADLs.    Recommendations for follow up therapy are one component of a multi-disciplinary discharge planning process, led by the attending physician.  Recommendations may be updated based on patient status, additional functional criteria and insurance authorization.  Follow Up Recommendations  Outpatient PT     Assistance Recommended at Discharge PRN  Equipment Recommendations  None recommended by PT       Precautions / Restrictions Precautions Precautions: Anterior Hip;Fall Precaution Booklet Issued: Yes (comment) Restrictions Weight Bearing Restrictions: Yes LLE Weight Bearing: Weight bearing as tolerated     Mobility  Bed Mobility Overal bed mobility: Needs Assistance Bed Mobility: Supine to Sit     Supine to sit: Min assist    Transfers Overall transfer level: Modified independent Equipment used: Rolling walker (2 wheels) Transfers: Sit to/from Stand Sit to Stand: Supervision      General transfer comment: no physical assistance to stand to RW    Ambulation/Gait Ambulation/Gait assistance: Supervision Gait Distance (Feet): 200 Feet Assistive  device: Rolling walker (2 wheels) Gait Pattern/deviations: Step-through pattern Gait velocity: decreased     General Gait Details: Pt was able to ambulate 200 ft with RW without LOB or safety concern   Stairs Stairs: Yes Stairs assistance: Supervision Stair Management: One rail Right;Step to pattern Number of Stairs: 4 General stair comments: Pt was easily and safely able to ascend descend 4 stair with +1 rail without difficulty or safety concern     Balance Overall balance assessment: Modified Independent      Cognition Arousal/Alertness: Awake/alert Behavior During Therapy: WFL for tasks assessed/performed Overall Cognitive Status: Within Functional Limits for tasks assessed      General Comments: Pt is A and O x 4               Pertinent Vitals/Pain Pain Assessment: 0-10 Pain Score: 6  Pain Location: L hip Pain Descriptors / Indicators: Aching Pain Intervention(s): Limited activity within patient's tolerance;Monitored during session;Premedicated before session;Repositioned;Ice applied     PT Goals (current goals can now be found in the care plan section) Acute Rehab PT Goals Patient Stated Goal: go home Progress towards PT goals: Progressing toward goals    Frequency    BID      PT Plan Discharge plan needs to be updated       AM-PAC PT "6 Clicks" Mobility   Outcome Measure  Help needed turning from your back to your side while in a flat bed without using bedrails?: A Little Help needed moving from lying on your back to sitting on the side of a flat bed without using bedrails?: A Little Help needed moving to and from a bed to  a chair (including a wheelchair)?: A Little Help needed standing up from a chair using your arms (e.g., wheelchair or bedside chair)?: A Little Help needed to walk in hospital room?: A Little Help needed climbing 3-5 steps with a railing? : A Little 6 Click Score: 18    End of Session   Activity Tolerance: Patient  tolerated treatment well Patient left: in bed;with call bell/phone within reach;with bed alarm set Nurse Communication: Mobility status;Patient requests pain meds PT Visit Diagnosis: Unsteadiness on feet (R26.81);Difficulty in walking, not elsewhere classified (R26.2);Pain Pain - Right/Left: Left Pain - part of body: Hip     Time: 8756-4332 PT Time Calculation (min) (ACUTE ONLY): 26 min  Charges:  $Gait Training: 8-22 mins $Therapeutic Exercise: 8-22 mins                     Jetta Lout PTA 02/12/21, 10:32 AM

## 2021-02-12 NOTE — Plan of Care (Signed)
°  Problem: Pain Managment: Goal: General experience of comfort will improve Outcome: Adequate for Discharge  Discharged home

## 2021-02-12 NOTE — Plan of Care (Signed)
°  Problem: Health Behavior/Discharge Planning: Goal: Ability to manage health-related needs will improve Outcome: Adequate for Discharge  Will be discharged today

## 2022-11-02 ENCOUNTER — Other Ambulatory Visit: Payer: Self-pay | Admitting: Internal Medicine

## 2022-11-02 DIAGNOSIS — Z1231 Encounter for screening mammogram for malignant neoplasm of breast: Secondary | ICD-10-CM
